# Patient Record
Sex: Female | Born: 1985 | Race: Black or African American | Hispanic: No | Marital: Married | Smoking: Never smoker
Health system: Southern US, Community
[De-identification: ages and names within clinical notes are randomized; demographics above are authoritative.]

## PROBLEM LIST (undated history)

## (undated) DIAGNOSIS — A749 Chlamydial infection, unspecified: Secondary | ICD-10-CM

## (undated) DIAGNOSIS — B009 Herpesviral infection, unspecified: Secondary | ICD-10-CM

## (undated) DIAGNOSIS — L659 Nonscarring hair loss, unspecified: Secondary | ICD-10-CM

## (undated) DIAGNOSIS — F419 Anxiety disorder, unspecified: Secondary | ICD-10-CM

## (undated) DIAGNOSIS — D241 Benign neoplasm of right breast: Secondary | ICD-10-CM

## (undated) DIAGNOSIS — R519 Headache, unspecified: Secondary | ICD-10-CM

## (undated) HISTORY — DX: Headache, unspecified: R51.9

## (undated) HISTORY — DX: Herpesviral infection, unspecified: B00.9

## (undated) HISTORY — DX: Nonscarring hair loss, unspecified: L65.9

## (undated) HISTORY — DX: Anxiety disorder, unspecified: F41.9

## (undated) HISTORY — DX: Chlamydial infection, unspecified: A74.9

## (undated) HISTORY — DX: Benign neoplasm of right breast: D24.1

---

## 2007-07-04 ENCOUNTER — Emergency Department (HOSPITAL_COMMUNITY): Admission: EM | Admit: 2007-07-04 | Discharge: 2007-07-04 | Payer: Self-pay | Admitting: Emergency Medicine

## 2007-10-14 ENCOUNTER — Other Ambulatory Visit: Admission: RE | Admit: 2007-10-14 | Discharge: 2007-10-14 | Payer: Self-pay | Admitting: Gynecology

## 2017-11-13 LAB — HM PAP SMEAR

## 2018-08-19 ENCOUNTER — Ambulatory Visit: Payer: Managed Care, Other (non HMO) | Admitting: Nurse Practitioner

## 2018-08-19 ENCOUNTER — Encounter: Payer: Self-pay | Admitting: Nurse Practitioner

## 2018-08-19 ENCOUNTER — Other Ambulatory Visit: Payer: Self-pay

## 2018-08-19 VITALS — BP 110/60 | HR 94 | Temp 98.3°F | Ht 67.4 in | Wt 127.6 lb

## 2018-08-19 DIAGNOSIS — N6321 Unspecified lump in the left breast, upper outer quadrant: Secondary | ICD-10-CM

## 2018-08-19 DIAGNOSIS — Z0000 Encounter for general adult medical examination without abnormal findings: Secondary | ICD-10-CM

## 2018-08-19 DIAGNOSIS — Z23 Encounter for immunization: Secondary | ICD-10-CM | POA: Diagnosis not present

## 2018-08-19 DIAGNOSIS — N63 Unspecified lump in unspecified breast: Secondary | ICD-10-CM

## 2018-08-19 LAB — POCT URINALYSIS DIPSTICK
Bilirubin, UA: NEGATIVE
Glucose, UA: NEGATIVE
Ketones, UA: NEGATIVE
Leukocytes, UA: NEGATIVE
Nitrite, UA: NEGATIVE
Protein, UA: NEGATIVE
Spec Grav, UA: 1.02 (ref 1.010–1.025)
Urobilinogen, UA: 0.2 E.U./dL
pH, UA: 6 (ref 5.0–8.0)

## 2018-08-19 MED ORDER — TETANUS-DIPHTH-ACELL PERTUSSIS 5-2.5-18.5 LF-MCG/0.5 IM SUSP
0.5000 mL | Freq: Once | INTRAMUSCULAR | Status: AC
  Administered 2018-08-19: 0.5 mL via INTRAMUSCULAR

## 2018-08-19 NOTE — Progress Notes (Addendum)
Subjective:     Patient ID: Jasmin Jackson , female    DOB: [birth year] , 33 y.o.   MRN: [id number]   Chief Complaint  Patient presents with  . Annual Exam  . Establish Care    HPI  Here to establish care - was in [place].  Has been here for a year and half.  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 [place]. No family history of breast cancer.   Mother passed with liver disease.  Father - healthy.  Brother - healthy.   She has seen a GYN here - Dr. Garwin Brothers      History reviewed. No pertinent past medical history.   Family History  Problem Relation Age of Onset  . Liver disease Mother     No current outpatient medications on file.   No Known Allergies   Review of Systems  Constitutional: Negative.   HENT: Negative.   Eyes: Negative.   Respiratory: Negative.   Cardiovascular: Negative.   Gastrointestinal: Negative.   Endocrine: Negative for polydipsia, polyphagia and polyuria.  Genitourinary: Negative.        Left breast lump   Musculoskeletal: Negative.   Skin: Negative.   Neurological: Negative.   Hematological: Negative.   Psychiatric/Behavioral: Negative.      Today's Vitals   08/19/18 1047  BP: 110/60  Pulse: 94  Temp: 98.3 F (36.8 C)  TempSrc: Oral  Weight: 127 lb 9.6 oz (57.9 kg)  Height: 5' 7.4" (1.712 m)  PainSc: 0-No pain   Body mass index is 19.75 kg/m.   Objective:  Physical Exam Vitals signs reviewed.  Constitutional:      Appearance: Normal appearance. She is well-developed.  HENT:     Head: Normocephalic and atraumatic.     Right Ear: Hearing, tympanic membrane, ear canal and external ear normal.     Left Ear: Hearing, tympanic membrane, ear canal and external ear normal.     Nose: Nose normal.  Eyes:     General: Lids are normal.     Conjunctiva/sclera:  Conjunctivae normal.     Pupils: Pupils are equal, round, and reactive to light.     Funduscopic exam:    Right eye: No papilledema.        Left eye: No papilledema.  Neck:     Musculoskeletal: Full passive range of motion without pain, normal range of motion and neck supple. No muscular tenderness.     Thyroid: No thyroid mass.     Vascular: No carotid bruit.  Cardiovascular:     Rate and Rhythm: Normal rate and regular rhythm.     Pulses: Normal pulses.     Heart sounds: Normal heart sounds. No murmur.  Pulmonary:     Effort: Pulmonary effort is normal.     Breath sounds: Normal breath sounds.  Chest:     Breasts:        Right: Normal. No swelling, mass, nipple discharge or tenderness.        Left: Mass (firm at 2 o'clock) present. No swelling, nipple discharge or tenderness.  Abdominal:     General: Abdomen is flat. Bowel sounds are normal.     Palpations: Abdomen is soft.  Musculoskeletal: Normal range of motion.        General: No swelling or tenderness.  Right lower leg: No edema.     Left lower leg: No edema.  Skin:    General: Skin is warm and dry.     Capillary Refill: Capillary refill takes less than 2 seconds.  Neurological:     General: No focal deficit present.     Mental Status: She is alert and oriented to person, place, and time.     Cranial Nerves: No cranial nerve deficit.     Sensory: No sensory deficit.  Psychiatric:        Mood and Affect: Mood normal.        Behavior: Behavior normal.        Thought Content: Thought content normal.        Judgment: Judgment normal.         Assessment And Plan:     1. Encounter for general adult medical examination w/o abnormal findings . Behavior modifications discussed and diet history reviewed.   . Pt will continue to exercise regularly and modify diet with low GI, plant based foods and decrease intake of processed foods.  . Recommend intake of daily multivitamin, Vitamin D, and calcium.  . Recommend  mammogram and colonoscopy for preventive screenings, as well as recommend immunizations that include influenza, TDAP - POCT Urinalysis Dipstick (81002)    1. Encounter for general adult medical examination w/o abnormal findings  Will give tetanus vaccine today while in office. Refer to order management. TDAP will be administered to adults 37-1 years old every 10 years. - POCT Urinalysis Dipstick (81002) - Tdap (BOOSTRIX) injection 0.5 mL - CBC no Diff - CMP14 + Anion Gap - Lipid Profile  3. Breast lump in lower outer quadrant  Reports has been present for several years  Will try to obtain her records from previous provider for review  Present on left breast at 2 o'clock  Minette Brine, FNP    THE PATIENT IS ENCOURAGED TO PRACTICE SOCIAL DISTANCING DUE TO THE COVID-19 PANDEMIC.

## 2019-03-11 ENCOUNTER — Ambulatory Visit: Payer: Managed Care, Other (non HMO) | Admitting: Nurse Practitioner

## 2019-03-19 ENCOUNTER — Other Ambulatory Visit: Payer: Self-pay

## 2019-03-19 ENCOUNTER — Ambulatory Visit: Payer: 59 | Admitting: Nurse Practitioner

## 2019-03-19 VITALS — BP 116/68 | HR 102 | Temp 98.2°F | Ht 67.4 in | Wt 126.2 lb

## 2019-03-19 DIAGNOSIS — F419 Anxiety disorder, unspecified: Secondary | ICD-10-CM

## 2019-03-19 DIAGNOSIS — G4700 Insomnia, unspecified: Secondary | ICD-10-CM

## 2019-03-19 DIAGNOSIS — R519 Headache, unspecified: Secondary | ICD-10-CM | POA: Diagnosis not present

## 2019-03-19 MED ORDER — TRAZODONE HCL 50 MG PO TABS: 50.0000 mg | ORAL_TABLET | Freq: Every day | ORAL | 0 refills | Status: DC

## 2019-03-19 MED ORDER — MAGNESIUM 200 MG PO TABS: ORAL_TABLET | ORAL | 2 refills | Status: DC

## 2019-03-19 NOTE — Progress Notes (Signed)
This visit occurred during the SARS-CoV-2 public health emergency.  Safety protocols were in place, including screening questions prior to the visit, additional usage of staff PPE, and extensive cleaning of exam room while observing appropriate contact time as indicated for disinfecting solutions.  Subjective:     Patient ID: Jasmin Jackson , female    DOB: December 24, 1985 , 34 y.o.   MRN: [id number]   Chief Complaint  Patient presents with  . Anxiety    HPI  She has never taken any medications in the past. She has tried melatonin without relief. She sees Dr. Garwin Brothers for her GYN  Anxiety Presents for initial visit. Onset was 1 to 6 months ago. The problem has been unchanged. Symptoms include insomnia and nervous/anxious behavior. Patient reports no chest pain or palpitations. Symptoms occur constantly. Nothing aggravates the symptoms.   Her past medical history is significant for anxiety/panic attacks. There is no history of anemia, arrhythmia, asthma or depression.  Insomnia Primary symptoms: no fragmented sleep, difficulty falling asleep.  The current episode started more than one month. The onset quality is sudden (noticed after working with [employer]). Typical bedtime:  10-11 P.M..  PMH includes: associated symptoms present, no hypertension.     No past medical history on file.   Family History  Problem Relation Age of Onset  . Liver disease Mother     No current outpatient medications on file.   No Known Allergies   Review of Systems  Constitutional: Negative.   Respiratory: Negative.   Cardiovascular: Negative for chest pain, palpitations and leg swelling.  Neurological: Negative.   Psychiatric/Behavioral: Negative for agitation. The patient is nervous/anxious and has insomnia.      Today's Vitals   03/19/19 1023  BP: 116/68  Pulse: (!) 102  Temp: 98.2 F (36.8 C)  TempSrc: Oral  Weight: 126 lb 3.2 oz (57.2 kg)  Height: 5' 7.4" (1.712 m)   Body mass index is 19.53  kg/m.   Objective:  Physical Exam Constitutional:      Appearance: Normal appearance.  Cardiovascular:     Rate and Rhythm: Normal rate and regular rhythm.     Pulses: Normal pulses.     Heart sounds: Normal heart sounds. No murmur.  Pulmonary:     Effort: Pulmonary effort is normal. No respiratory distress.     Breath sounds: Normal breath sounds.  Skin:    Capillary Refill: Capillary refill takes less than 2 seconds.  Neurological:     General: No focal deficit present.     Mental Status: She is alert and oriented to person, place, and time.  Psychiatric:        Mood and Affect: Mood normal.        Behavior: Behavior normal.        Thought Content: Thought content normal.        Judgment: Judgment normal.         Assessment And Plan:     1. Anxiety  Will try her on trazodone this will also help with insomnia.  - traZODone (DESYREL) 50 MG tablet; Take 1 tablet (50 mg total) by mouth at bedtime.  Dispense: 30 tablet; Refill: 0  2. Insomnia, unspecified type  Melatonin was ineffective  Will try trazodone as needed - traZODone (DESYREL) 50 MG tablet; Take 1 tablet (50 mg total) by mouth at bedtime.  Dispense: 30 tablet; Refill: 0  3. Nonintractable episodic headache, unspecified headache type  May be occurring due to lack of sleep and anxiety  Advised to take tylenol as needed for headache and to make sure staying well hydrated with water and avoiding food triggers.   Minette Brine, FNP    THE PATIENT IS ENCOURAGED TO PRACTICE SOCIAL DISTANCING DUE TO THE COVID-19 PANDEMIC.

## 2019-03-23 ENCOUNTER — Encounter: Payer: Self-pay | Admitting: Nurse Practitioner

## 2019-04-10 ENCOUNTER — Other Ambulatory Visit: Payer: Self-pay | Admitting: Nurse Practitioner

## 2019-04-10 DIAGNOSIS — F419 Anxiety disorder, unspecified: Secondary | ICD-10-CM

## 2019-04-10 DIAGNOSIS — G4700 Insomnia, unspecified: Secondary | ICD-10-CM

## 2019-04-15 ENCOUNTER — Ambulatory Visit: Payer: 59 | Admitting: Nurse Practitioner

## 2019-04-15 ENCOUNTER — Other Ambulatory Visit: Payer: Self-pay

## 2019-04-15 ENCOUNTER — Encounter: Payer: Self-pay | Admitting: Nurse Practitioner

## 2019-04-15 VITALS — BP 122/86 | HR 81 | Temp 97.7°F | Ht 68.0 in | Wt 125.2 lb

## 2019-04-15 DIAGNOSIS — G8929 Other chronic pain: Secondary | ICD-10-CM

## 2019-04-15 DIAGNOSIS — G4700 Insomnia, unspecified: Secondary | ICD-10-CM | POA: Diagnosis not present

## 2019-04-15 DIAGNOSIS — R519 Headache, unspecified: Secondary | ICD-10-CM | POA: Diagnosis not present

## 2019-04-15 DIAGNOSIS — F419 Anxiety disorder, unspecified: Secondary | ICD-10-CM

## 2019-04-15 MED ORDER — UBRELVY 50 MG PO TABS: 1.0000 | ORAL_TABLET | Freq: Every day | ORAL | 2 refills | Status: DC | PRN

## 2019-04-15 NOTE — Progress Notes (Signed)
  This visit occurred during the SARS-CoV-2 public health emergency.  Safety protocols were in place, including screening questions prior to the visit, additional usage of staff PPE, and extensive cleaning of exam room while observing appropriate contact time as indicated for disinfecting solutions.  Subjective:     Patient ID: Jasmin Jackson , female    DOB: [birth year] , 34 y.o.   MRN: [id number]   Chief Complaint  Patient presents with  . Anxiety    HPI  She takes the sleep aid about 9pm which has helped her to sleep better but she is groggy.    Anxiety Presents for follow-up visit. Patient reports no chest pain, dizziness or palpitations. Symptoms occur most days. The severity of symptoms is moderate.       Past Medical History:  Diagnosis Date  . Anxiety      Family History  Problem Relation Age of Onset  . Liver disease Mother      Current Outpatient Medications:  Marland Kitchen  Magnesium 200 MG TABS, Take one tablet by mouth with evening meals, Disp: 30 tablet, Rfl: 2 .  traZODone (DESYREL) 50 MG tablet, Take 1 tablet (50 mg total) by mouth at bedtime., Disp: 30 tablet, Rfl: 0   No Known Allergies   Review of Systems  Constitutional: Negative.   Respiratory: Negative.  Negative for cough.   Cardiovascular: Negative.  Negative for chest pain, palpitations and leg swelling.  Neurological: Negative for dizziness and headaches.  Psychiatric/Behavioral: Negative.      Today's Vitals   04/15/19 1448  BP: 122/86  Pulse: 81  Temp: 97.7 F (36.5 C)  Weight: 125 lb 3.2 oz (56.8 kg)  Height: 5\' 8"  (1.727 m)   Body mass index is 19.04 kg/m.   Objective:  Physical Exam Constitutional:      Appearance: Normal appearance.  Cardiovascular:     Rate and Rhythm: Normal rate and regular rhythm.     Pulses: Normal pulses.     Heart sounds: Normal heart sounds. No murmur.  Neurological:     General: No focal deficit present.     Mental Status: She is alert and oriented to  person, place, and time.     Cranial Nerves: No cranial nerve deficit.  Psychiatric:        Mood and Affect: Mood normal.        Behavior: Behavior normal.        Thought Content: Thought content normal.        Judgment: Judgment normal.         Assessment And Plan:     1. Anxiety  Chronic, she has this when she is unable to sleep  2. Insomnia, unspecified type  Take 1/2 tab of trazadone to see if this helps with your grogginess in am.   3. Chronic nonintractable headache, unspecified headache type  She has tried imitrex and maxalt without benefit and caused her sleepiness.  - Ubrogepant (UBRELVY) 50 MG TABS; Take 1 tablet by mouth daily as needed. At onset of headache  Dispense: 10 tablet; Refill: 2   Minette Brine, FNP    THE PATIENT IS ENCOURAGED TO PRACTICE SOCIAL DISTANCING DUE TO THE COVID-19 PANDEMIC.

## 2019-04-16 ENCOUNTER — Ambulatory Visit: Payer: 59 | Admitting: Nurse Practitioner

## 2019-04-17 ENCOUNTER — Telehealth: Payer: Self-pay

## 2019-04-17 NOTE — Telephone Encounter (Signed)
PA for Roselyn Meier has been submitted through covermymeds and we are just waiting for the determination. YL,RMA

## 2019-04-20 ENCOUNTER — Encounter: Payer: Self-pay | Admitting: Nurse Practitioner

## 2019-04-22 ENCOUNTER — Telehealth: Payer: Self-pay

## 2019-04-22 DIAGNOSIS — F320 Major depressive disorder, single episode, mild: Secondary | ICD-10-CM | POA: Diagnosis not present

## 2019-04-22 NOTE — Telephone Encounter (Signed)
I called patient and left her a v/m notifying her that her PA for Roselyn Meier has been approved from 04/21/19 to 10/21/19. YL,RMA

## 2019-05-08 DIAGNOSIS — F320 Major depressive disorder, single episode, mild: Secondary | ICD-10-CM | POA: Diagnosis not present

## 2019-05-22 DIAGNOSIS — F320 Major depressive disorder, single episode, mild: Secondary | ICD-10-CM | POA: Diagnosis not present

## 2019-06-05 DIAGNOSIS — F320 Major depressive disorder, single episode, mild: Secondary | ICD-10-CM | POA: Diagnosis not present

## 2019-06-19 DIAGNOSIS — N909 Noninflammatory disorder of vulva and perineum, unspecified: Secondary | ICD-10-CM | POA: Diagnosis not present

## 2019-07-15 DIAGNOSIS — F320 Major depressive disorder, single episode, mild: Secondary | ICD-10-CM | POA: Diagnosis not present

## 2019-07-29 DIAGNOSIS — F320 Major depressive disorder, single episode, mild: Secondary | ICD-10-CM | POA: Diagnosis not present

## 2019-08-14 DIAGNOSIS — F320 Major depressive disorder, single episode, mild: Secondary | ICD-10-CM | POA: Diagnosis not present

## 2019-08-21 ENCOUNTER — Other Ambulatory Visit: Payer: Self-pay

## 2019-08-21 ENCOUNTER — Encounter: Payer: Self-pay | Admitting: Nurse Practitioner

## 2019-08-21 ENCOUNTER — Ambulatory Visit (INDEPENDENT_AMBULATORY_CARE_PROVIDER_SITE_OTHER): Payer: 59 | Admitting: Nurse Practitioner

## 2019-08-21 VITALS — BP 112/78 | HR 85 | Temp 97.9°F | Ht 67.2 in | Wt 128.8 lb

## 2019-08-21 DIAGNOSIS — M25512 Pain in left shoulder: Secondary | ICD-10-CM

## 2019-08-21 DIAGNOSIS — Z1159 Encounter for screening for other viral diseases: Secondary | ICD-10-CM | POA: Diagnosis not present

## 2019-08-21 DIAGNOSIS — N63 Unspecified lump in unspecified breast: Secondary | ICD-10-CM

## 2019-08-21 DIAGNOSIS — Z0001 Encounter for general adult medical examination with abnormal findings: Secondary | ICD-10-CM

## 2019-08-21 DIAGNOSIS — Z0000 Encounter for general adult medical examination without abnormal findings: Secondary | ICD-10-CM | POA: Diagnosis not present

## 2019-08-21 NOTE — Patient Instructions (Addendum)
Health Maintenance, Female Adopting a healthy lifestyle and getting preventive care are important in promoting health and wellness. Ask your health care provider about:  The right schedule for you to have regular tests and exams.  Things you can do on your own to prevent diseases and keep yourself healthy. What should I know about diet, weight, and exercise? Eat a healthy diet   Eat a diet that includes plenty of vegetables, fruits, low-fat dairy products, and lean protein.  Do not eat a lot of foods that are high in solid fats, added sugars, or sodium. Maintain a healthy weight Body mass index (BMI) is used to identify weight problems. It estimates body fat based on height and weight. Your health care provider can help determine your BMI and help you achieve or maintain a healthy weight. Get regular exercise Get regular exercise. This is one of the most important things you can do for your health. Most adults should:  Exercise for at least 150 minutes each week. The exercise should increase your heart rate and make you sweat (moderate-intensity exercise).  Do strengthening exercises at least twice a week. This is in addition to the moderate-intensity exercise.  Spend less time sitting. Even light physical activity can be beneficial. Watch cholesterol and blood lipids Have your blood tested for lipids and cholesterol at 32 years of age, then have this test every 5 years. Have your cholesterol levels checked more often if:  Your lipid or cholesterol levels are high.  You are older than 32 years of age.  You are at high risk for heart disease. What should I know about cancer screening? Depending on your health history and family history, you may need to have cancer screening at various ages. This may include screening for:  Breast cancer.  Cervical cancer.  Colorectal cancer.  Skin cancer.  Lung cancer. What should I know about heart disease, diabetes, and high blood  pressure? Blood pressure and heart disease  High blood pressure causes heart disease and increases the risk of stroke. This is more likely to develop in people who have high blood pressure readings, are of African descent, or are overweight.  Have your blood pressure checked: ? Every 3-5 years if you are 18-39 years of age. ? Every year if you are 40 years old or older. Diabetes Have regular diabetes screenings. This checks your fasting blood sugar level. Have the screening done:  Once every three years after age 40 if you are at a normal weight and have a low risk for diabetes.  More often and at a younger age if you are overweight or have a high risk for diabetes. What should I know about preventing infection? Hepatitis B If you have a higher risk for hepatitis B, you should be screened for this virus. Talk with your health care provider to find out if you are at risk for hepatitis B infection. Hepatitis C Testing is recommended for:  Everyone born from 1945 through 1965.  Anyone with known risk factors for hepatitis C. Sexually transmitted infections (STIs)  Get screened for STIs, including gonorrhea and chlamydia, if: ? You are sexually active and are younger than 32 years of age. ? You are older than 32 years of age and your health care provider tells you that you are at risk for this type of infection. ? Your sexual activity has changed since you were last screened, and you are at increased risk for chlamydia or gonorrhea. Ask your health care provider if   you are at risk.  Ask your health care provider about whether you are at high risk for HIV. Your health care provider may recommend a prescription medicine to help prevent HIV infection. If you choose to take medicine to prevent HIV, you should first get tested for HIV. You should then be tested every 3 months for as long as you are taking the medicine. Pregnancy  If you are about to stop having your period (premenopausal) and  you may become pregnant, seek counseling before you get pregnant.  Take 400 to 800 micrograms (mcg) of folic acid every day if you become pregnant.  Ask for birth control (contraception) if you want to prevent pregnancy. Osteoporosis and menopause Osteoporosis is a disease in which the bones lose minerals and strength with aging. This can result in bone fractures. If you are 99 years old or older, or if you are at risk for osteoporosis and fractures, ask your health care provider if you should:  Be screened for bone loss.  Take a calcium or vitamin D supplement to lower your risk of fractures.  Be given hormone replacement therapy (HRT) to treat symptoms of menopause. Follow these instructions at home: Lifestyle  Do not use any products that contain nicotine or tobacco, such as cigarettes, e-cigarettes, and chewing tobacco. If you need help quitting, ask your health care provider.  Do not use street drugs.  Do not share needles.  Ask your health care provider for help if you need support or information about quitting drugs. Alcohol use  Do not drink alcohol if: ? Your health care provider tells you not to drink. ? You are pregnant, may be pregnant, or are planning to become pregnant.  If you drink alcohol: ? Limit how much you use to 0-1 drink a day. ? Limit intake if you are breastfeeding.  Be aware of how much alcohol is in your drink. In the [place] one drink equals one 12 oz bottle of beer (355 mL), one 5 oz glass of wine (148 mL), or one 1 oz glass of hard liquor (44 mL). General instructions  Schedule regular health, dental, and eye exams.  Stay current with your vaccines.  Tell your health care provider if: ? You often feel depressed. ? You have ever been abused or do not feel safe at home. Summary  Adopting a healthy lifestyle and getting preventive care are important in promoting health and wellness.  Follow your health care provider's instructions about healthy  diet, exercising, and getting tested or screened for diseases.  Follow your health care provider's instructions on monitoring your cholesterol and blood pressure. This information is not intended to replace advice given to you by your health care provider. Make sure you discuss any questions you have with your health care provider. Document Revised: 01/09/2018 Document Reviewed: 01/09/2018 Elsevier Patient Education  [facility] MASSAGES AND VOLTAREN GEL OVER THE COUNTER TO HELP WITH SHOULDER PAIN.

## 2019-08-21 NOTE — Progress Notes (Signed)
I, [employer] as a Education administrator for [employer], FNP.,have documented all relevant documentation on the behalf of Minette Brine, FNP,as directed by  Minette Brine, FNP while in the presence of Minette Brine, [place]. This visit occurred during the SARS-CoV-2 public health emergency.  Safety protocols were in place, including screening questions prior to the visit, additional usage of staff PPE, and extensive cleaning of exam room while observing appropriate contact time as indicated for disinfecting solutions.  Subjective:     Patient ID: Jasmin Jackson , female    DOB: [birth year] , 34 y.o.   MRN: [id number]   Chief Complaint  Patient presents with  . Annual Exam    HPI  Patient here for health maintenance. Patient complains of left should pain that started 3 weeks ago. Pain is sharp and is aggravated by using the phone at work and head movements. The pain last for 5 minutes for less.  She goes to the opthalmologist yearly.    Past Medical History:  Diagnosis Date  . Anxiety      Family History  Problem Relation Age of Onset  . Liver disease Mother      Current Outpatient Medications:  Marland Kitchen  Magnesium 200 MG TABS, Take one tablet by mouth with evening meals, Disp: 30 tablet, Rfl: 2 .  traZODone (DESYREL) 50 MG tablet, Take 1 tablet (50 mg total) by mouth at bedtime., Disp: 30 tablet, Rfl: 0 .  Ubrogepant (UBRELVY) 50 MG TABS, Take 1 tablet by mouth daily as needed. At onset of headache, Disp: 10 tablet, Rfl: 2   No Known Allergies    The patient states she uses none for birth control. Patient's last menstrual period was 08/21/2019.. Negative for Dysmenorrhea and Negative for Menorrhagia. Negative for: breast discharge, breast lump(s), breast pain and breast self exam. Associated symptoms include abnormal vaginal bleeding. Pertinent negatives include abnormal bleeding (hematology), anxiety, decreased libido, depression, difficulty falling sleep, dyspareunia, history of  infertility, nocturia, sexual dysfunction, sleep disturbances, urinary incontinence, urinary urgency, vaginal discharge and vaginal itching. Diet regular.The patient states her exercise level is minimal walks at work.  The patient's tobacco use is:   Social History   Tobacco Use  Smoking Status Never Smoker  Smokeless Tobacco Never Used   She has been exposed to passive smoke. The patient's alcohol use is:  Social History   Substance and Sexual Activity  Alcohol Use Yes   Comment: occasionally    Additional information: Last pap 2019 will get records from Dr.Cousins, next one scheduled for .    Review of Systems  Constitutional: Negative.   HENT: Negative.   Eyes: Negative.        Does wear glasses has eyes checked every 2 years, does not have on at this time.   Respiratory: Negative.   Cardiovascular: Negative.   Gastrointestinal: Negative.   Endocrine: Negative.   Genitourinary: Negative.   Musculoskeletal: Positive for arthralgias (left shoulder pain).  Skin: Negative.   Neurological: Negative.   Psychiatric/Behavioral: Negative.      Today's Vitals   08/21/19 1459  BP: 112/78  Pulse: 85  Temp: 97.9 F (36.6 C)  TempSrc: Oral  Weight: 128 lb 12.8 oz (58.4 kg)  Height: 5' 7.2" (1.707 m)  PainSc: 6   PainLoc: Arm   Body mass index is 20.05 kg/m.   Objective:  Physical Exam Constitutional:      General: She is not in acute distress.    Appearance: Normal appearance. She is normal weight.  HENT:  Right Ear: Tympanic membrane, ear canal and external ear normal. There is no impacted cerumen (small amount to ear canal).     Left Ear: Tympanic membrane, ear canal and external ear normal. There is no impacted cerumen (small amount to ear canal).  Eyes:     Extraocular Movements: Extraocular movements intact.     Conjunctiva/sclera: Conjunctivae normal.     Pupils: Pupils are equal, round, and reactive to light.  Cardiovascular:     Rate and Rhythm: Normal  rate and regular rhythm.     Pulses: Normal pulses.     Heart sounds: Normal heart sounds. No murmur heard.   Pulmonary:     Effort: Pulmonary effort is normal. No respiratory distress.     Breath sounds: Normal breath sounds.  Chest:     Chest wall: No mass.     Breasts: Tanner Score is 5.        Right: Normal.        Left: Mass (at 11 o'clock inner breast area) present.  Abdominal:     General: Abdomen is flat. Bowel sounds are normal.     Palpations: Abdomen is soft.  Musculoskeletal:        General: Normal range of motion.     Right shoulder: Normal.     Left shoulder: Normal. No tenderness or crepitus. Normal range of motion.     Cervical back: Normal range of motion and neck supple.     Right lower leg: No edema.     Left lower leg: No edema.  Lymphadenopathy:     Upper Body:     Right upper body: No supraclavicular or axillary adenopathy.     Left upper body: No supraclavicular or axillary adenopathy.  Skin:    General: Skin is warm and dry.     Capillary Refill: Capillary refill takes less than 2 seconds.  Neurological:     General: No focal deficit present.     Mental Status: She is alert and oriented to person, place, and time.     Cranial Nerves: No cranial nerve deficit.  Psychiatric:        Mood and Affect: Mood normal.        Behavior: Behavior normal.        Thought Content: Thought content normal.        Judgment: Judgment normal.         Assessment And Plan:     1. Annual visit for general adult medical examination with abnormal findings . Behavior modifications discussed and diet history reviewed.   . Pt will continue to exercise regularly and modify diet with low GI, plant based foods and decrease intake of processed foods.  . Recommend intake of daily multivitamin, Vitamin D, and calcium.  . Recommend diagnostic mammogram to check nodule that has been present for more than 5 years for preventive screenings, as well as recommend immunizations that  include influenza, TDAP (up to date) . Will get records from Dr. Garwin Brothers for last PAP and HIV screening - CMP14+EGFR - CBC - Lipid panel  2. Encounter for hepatitis C screening test for low risk patient  Will check Hepatitis C screening due to recent recommendations to screen all adults 18 years and older - Hepatitis C antibody  3. Breast nodule  Firm, mobile nodule palpated at 11 o'clock inner area  Will send for diagnostic mammogram, last done in 2016 in [place]; Future  4. Left shoulder pain, unspecified  chronicity  No abnormal findings with shoulder, this is likely related to overuse with phone and resting on shoulder   Encouraged to get headset while on phone and to consider massage therapy - VITAMIN D 25 Hydroxy (Vit-D Deficiency, Fractures)    Patient was given opportunity to ask questions. Patient verbalized understanding of the plan and was able to repeat key elements of the plan. All questions were answered to their satisfaction.   Minette Brine, FNP   I, Minette Brine, FNP, have reviewed all documentation for this visit. The documentation on 08/21/19 for the exam, diagnosis, procedures, and orders are all accurate and complete.  THE PATIENT IS ENCOURAGED TO PRACTICE SOCIAL DISTANCING DUE TO THE COVID-19 PANDEMIC.

## 2019-08-22 LAB — LIPID PANEL
Chol/HDL Ratio: 2.9 ratio (ref 0.0–4.4)
Cholesterol, Total: 173 mg/dL (ref 100–199)
HDL: 60 mg/dL (ref >39)
LDL Chol Calc (NIH): 102 mg/dL — ABNORMAL HIGH (ref 0–99)
Triglycerides: 53 mg/dL (ref 0–149)
VLDL Cholesterol Cal: 11 mg/dL (ref 5–40)

## 2019-08-22 LAB — CMP14+EGFR
ALT: 6 IU/L (ref 0–32)
AST: 14 IU/L (ref 0–40)
Albumin/Globulin Ratio: 1.7 (ref 1.2–2.2)
Albumin: 4.6 g/dL (ref 3.8–4.8)
Alkaline Phosphatase: 70 IU/L (ref 48–121)
BUN/Creatinine Ratio: 9 (ref 9–23)
BUN: 7 mg/dL (ref 6–20)
Bilirubin Total: 0.5 mg/dL (ref 0.0–1.2)
CO2: 23 mmol/L (ref 20–29)
Calcium: 9.2 mg/dL (ref 8.7–10.2)
Chloride: 104 mmol/L (ref 96–106)
Creatinine, Ser: 0.76 mg/dL (ref 0.57–1.00)
GFR calc Af Amer: 120 mL/min/{1.73_m2} (ref >59)
GFR calc non Af Amer: 104 mL/min/{1.73_m2} (ref >59)
Globulin, Total: 2.7 g/dL (ref 1.5–4.5)
Glucose: 61 mg/dL — ABNORMAL LOW (ref 65–99)
Potassium: 3.8 mmol/L (ref 3.5–5.2)
Sodium: 141 mmol/L (ref 134–144)
Total Protein: 7.3 g/dL (ref 6.0–8.5)

## 2019-08-22 LAB — CBC
Hematocrit: 36.9 % (ref 34.0–46.6)
Hemoglobin: 12.7 g/dL (ref 11.1–15.9)
MCH: 28.5 pg (ref 26.6–33.0)
MCHC: 34.4 g/dL (ref 31.5–35.7)
MCV: 83 fL (ref 79–97)
Platelets: 306 10*3/uL (ref 150–450)
RBC: 4.45 x10E6/uL (ref 3.77–5.28)
RDW: 13.3 % (ref 11.7–15.4)
WBC: 4.2 10*3/uL (ref 3.4–10.8)

## 2019-08-22 LAB — VITAMIN D 25 HYDROXY (VIT D DEFICIENCY, FRACTURES): Vit D, 25-Hydroxy: 13.9 ng/mL — ABNORMAL LOW (ref 30.0–100.0)

## 2019-08-22 LAB — HEPATITIS C ANTIBODY: Hep C Virus Ab: <0.1 s/co ratio (ref 0.0–0.9)

## 2019-08-26 ENCOUNTER — Other Ambulatory Visit: Payer: Self-pay | Admitting: Nurse Practitioner

## 2019-08-26 DIAGNOSIS — N63 Unspecified lump in unspecified breast: Secondary | ICD-10-CM

## 2019-08-27 ENCOUNTER — Encounter: Payer: Self-pay | Admitting: Nurse Practitioner

## 2019-08-28 DIAGNOSIS — F320 Major depressive disorder, single episode, mild: Secondary | ICD-10-CM | POA: Diagnosis not present

## 2019-09-23 DIAGNOSIS — Z20828 Contact with and (suspected) exposure to other viral communicable diseases: Secondary | ICD-10-CM | POA: Diagnosis not present

## 2019-10-02 DIAGNOSIS — F320 Major depressive disorder, single episode, mild: Secondary | ICD-10-CM | POA: Diagnosis not present

## 2019-10-15 DIAGNOSIS — F320 Major depressive disorder, single episode, mild: Secondary | ICD-10-CM | POA: Diagnosis not present

## 2019-11-11 DIAGNOSIS — H52223 Regular astigmatism, bilateral: Secondary | ICD-10-CM | POA: Diagnosis not present

## 2019-12-02 DIAGNOSIS — L659 Nonscarring hair loss, unspecified: Secondary | ICD-10-CM | POA: Diagnosis not present

## 2019-12-02 DIAGNOSIS — Z01419 Encounter for gynecological examination (general) (routine) without abnormal findings: Secondary | ICD-10-CM | POA: Diagnosis not present

## 2019-12-02 DIAGNOSIS — Z124 Encounter for screening for malignant neoplasm of cervix: Secondary | ICD-10-CM | POA: Diagnosis not present

## 2019-12-08 ENCOUNTER — Ambulatory Visit
Admission: RE | Admit: 2019-12-08 | Discharge: 2019-12-08 | Disposition: A | Discharge status: Home / Self Care | Payer: 59 | Source: Ambulatory Visit | Attending: Nurse Practitioner | Admitting: Nurse Practitioner

## 2019-12-08 ENCOUNTER — Other Ambulatory Visit: Payer: Self-pay

## 2019-12-08 DIAGNOSIS — N63 Unspecified lump in unspecified breast: Secondary | ICD-10-CM

## 2019-12-08 DIAGNOSIS — N6021 Fibroadenosis of right breast: Secondary | ICD-10-CM | POA: Diagnosis not present

## 2019-12-29 ENCOUNTER — Other Ambulatory Visit: Payer: Self-pay | Admitting: General Surgery

## 2019-12-29 DIAGNOSIS — N6320 Unspecified lump in the left breast, unspecified quadrant: Secondary | ICD-10-CM | POA: Diagnosis not present

## 2020-02-06 DIAGNOSIS — N912 Amenorrhea, unspecified: Secondary | ICD-10-CM | POA: Diagnosis not present

## 2020-02-10 DIAGNOSIS — N912 Amenorrhea, unspecified: Secondary | ICD-10-CM | POA: Diagnosis not present

## 2020-02-25 DIAGNOSIS — Z3200 Encounter for pregnancy test, result unknown: Secondary | ICD-10-CM | POA: Diagnosis not present

## 2020-03-25 DIAGNOSIS — O3413 Maternal care for benign tumor of corpus uteri, third trimester: Secondary | ICD-10-CM | POA: Diagnosis not present

## 2020-03-25 DIAGNOSIS — O98813 Other maternal infectious and parasitic diseases complicating pregnancy, third trimester: Secondary | ICD-10-CM | POA: Diagnosis not present

## 2020-03-25 DIAGNOSIS — Z3403 Encounter for supervision of normal first pregnancy, third trimester: Secondary | ICD-10-CM | POA: Diagnosis not present

## 2020-03-25 DIAGNOSIS — N898 Other specified noninflammatory disorders of vagina: Secondary | ICD-10-CM | POA: Diagnosis not present

## 2020-03-25 DIAGNOSIS — Z3401 Encounter for supervision of normal first pregnancy, first trimester: Secondary | ICD-10-CM | POA: Diagnosis not present

## 2020-03-25 DIAGNOSIS — Z7689 Persons encountering health services in other specified circumstances: Secondary | ICD-10-CM | POA: Diagnosis not present

## 2020-03-25 LAB — OB RESULTS CONSOLE ABO/RH: RH Type: POSITIVE

## 2020-03-25 LAB — OB RESULTS CONSOLE GC/CHLAMYDIA
Chlamydia: POSITIVE
Gonorrhea: NEGATIVE

## 2020-03-25 LAB — OB RESULTS CONSOLE HGB/HCT, BLOOD
HCT: 34 (ref 29–41)
Hemoglobin: 11.8

## 2020-03-25 LAB — OB RESULTS CONSOLE RUBELLA ANTIBODY, IGM: Rubella: IMMUNE

## 2020-03-25 LAB — OB RESULTS CONSOLE RPR: RPR: NONREACTIVE

## 2020-03-25 LAB — OB RESULTS CONSOLE PLATELET COUNT: Platelets: 271

## 2020-03-25 LAB — HEPATITIS C ANTIBODY: HCV Ab: NEGATIVE

## 2020-03-25 LAB — OB RESULTS CONSOLE HIV ANTIBODY (ROUTINE TESTING): HIV: NONREACTIVE

## 2020-03-25 LAB — OB RESULTS CONSOLE ANTIBODY SCREEN: Antibody Screen: NEGATIVE

## 2020-04-22 DIAGNOSIS — Z3402 Encounter for supervision of normal first pregnancy, second trimester: Secondary | ICD-10-CM | POA: Diagnosis not present

## 2020-04-22 DIAGNOSIS — O98812 Other maternal infectious and parasitic diseases complicating pregnancy, second trimester: Secondary | ICD-10-CM | POA: Diagnosis not present

## 2020-04-29 ENCOUNTER — Ambulatory Visit: Payer: 59

## 2020-04-29 ENCOUNTER — Other Ambulatory Visit: Payer: Self-pay | Admitting: Obstetrics and Gynecology

## 2020-04-29 ENCOUNTER — Other Ambulatory Visit: Payer: Self-pay | Admitting: Nurse Practitioner

## 2020-04-29 DIAGNOSIS — Z3689 Encounter for other specified antenatal screening: Secondary | ICD-10-CM

## 2020-05-20 ENCOUNTER — Encounter: Payer: Self-pay | Admitting: *Deleted

## 2020-05-25 ENCOUNTER — Other Ambulatory Visit: Payer: Self-pay | Admitting: *Deleted

## 2020-05-25 ENCOUNTER — Other Ambulatory Visit: Payer: Self-pay | Admitting: Obstetrics and Gynecology

## 2020-05-25 ENCOUNTER — Encounter: Payer: Self-pay | Admitting: *Deleted

## 2020-05-25 ENCOUNTER — Other Ambulatory Visit: Payer: Self-pay

## 2020-05-25 ENCOUNTER — Ambulatory Visit: Payer: 59 | Admitting: *Deleted

## 2020-05-25 ENCOUNTER — Ambulatory Visit: Payer: 59 | Attending: Obstetrics and Gynecology

## 2020-05-25 VITALS — BP 105/63 | HR 72

## 2020-05-25 DIAGNOSIS — O3412 Maternal care for benign tumor of corpus uteri, second trimester: Secondary | ICD-10-CM

## 2020-05-25 DIAGNOSIS — O43192 Other malformation of placenta, second trimester: Secondary | ICD-10-CM

## 2020-05-25 DIAGNOSIS — O283 Abnormal ultrasonic finding on antenatal screening of mother: Secondary | ICD-10-CM

## 2020-05-25 DIAGNOSIS — Z3689 Encounter for other specified antenatal screening: Secondary | ICD-10-CM

## 2020-05-25 DIAGNOSIS — O358XX0 Maternal care for other (suspected) fetal abnormality and damage, not applicable or unspecified: Secondary | ICD-10-CM

## 2020-05-25 DIAGNOSIS — D259 Leiomyoma of uterus, unspecified: Secondary | ICD-10-CM

## 2020-05-25 DIAGNOSIS — Z3A19 19 weeks gestation of pregnancy: Secondary | ICD-10-CM | POA: Diagnosis not present

## 2020-05-25 DIAGNOSIS — Z363 Encounter for antenatal screening for malformations: Secondary | ICD-10-CM | POA: Diagnosis not present

## 2020-06-05 DIAGNOSIS — U071 COVID-19: Secondary | ICD-10-CM

## 2020-06-05 HISTORY — DX: COVID-19: U07.1

## 2020-06-06 ENCOUNTER — Encounter (HOSPITAL_COMMUNITY): Payer: Self-pay | Admitting: Obstetrics and Gynecology

## 2020-06-06 ENCOUNTER — Other Ambulatory Visit: Payer: Self-pay

## 2020-06-06 ENCOUNTER — Inpatient Hospital Stay (HOSPITAL_COMMUNITY)
Admission: AD | Admit: 2020-06-06 | Discharge: 2020-06-07 | Disposition: A | Discharge status: Home / Self Care | Payer: 59 | Attending: Obstetrics and Gynecology | Admitting: Obstetrics and Gynecology

## 2020-06-06 DIAGNOSIS — O98512 Other viral diseases complicating pregnancy, second trimester: Secondary | ICD-10-CM | POA: Insufficient documentation

## 2020-06-06 DIAGNOSIS — R109 Unspecified abdominal pain: Secondary | ICD-10-CM | POA: Insufficient documentation

## 2020-06-06 DIAGNOSIS — O26892 Other specified pregnancy related conditions, second trimester: Secondary | ICD-10-CM | POA: Insufficient documentation

## 2020-06-06 DIAGNOSIS — O26899 Other specified pregnancy related conditions, unspecified trimester: Secondary | ICD-10-CM

## 2020-06-06 DIAGNOSIS — U071 COVID-19: Secondary | ICD-10-CM | POA: Insufficient documentation

## 2020-06-06 DIAGNOSIS — Z3A21 21 weeks gestation of pregnancy: Secondary | ICD-10-CM | POA: Insufficient documentation

## 2020-06-06 NOTE — MAU Note (Signed)
Pt stated she tested positive for covid yesterday. Started feeling upper and lower abd pain earlier today that has continued. Denies any vag bleeding or discharge.

## 2020-06-07 DIAGNOSIS — U071 COVID-19: Secondary | ICD-10-CM | POA: Diagnosis not present

## 2020-06-07 DIAGNOSIS — Z3A21 21 weeks gestation of pregnancy: Secondary | ICD-10-CM | POA: Diagnosis not present

## 2020-06-07 DIAGNOSIS — O98512 Other viral diseases complicating pregnancy, second trimester: Secondary | ICD-10-CM | POA: Diagnosis not present

## 2020-06-07 DIAGNOSIS — O26892 Other specified pregnancy related conditions, second trimester: Secondary | ICD-10-CM | POA: Diagnosis not present

## 2020-06-07 DIAGNOSIS — Z1159 Encounter for screening for other viral diseases: Secondary | ICD-10-CM | POA: Diagnosis not present

## 2020-06-07 DIAGNOSIS — R109 Unspecified abdominal pain: Secondary | ICD-10-CM | POA: Diagnosis not present

## 2020-06-07 LAB — URINALYSIS, ROUTINE W REFLEX MICROSCOPIC
Bacteria, UA: NONE SEEN
Bilirubin Urine: NEGATIVE
Glucose, UA: NEGATIVE mg/dL
Ketones, ur: NEGATIVE mg/dL
Leukocytes,Ua: NEGATIVE
Nitrite: NEGATIVE
Protein, ur: NEGATIVE mg/dL
Specific Gravity, Urine: 1.006 (ref 1.005–1.030)
pH: 7 (ref 5.0–8.0)

## 2020-06-07 LAB — SARS CORONAVIRUS 2 (TAT 6-24 HRS): SARS Coronavirus 2: POSITIVE — AB

## 2020-06-07 NOTE — MAU Note (Signed)
Pt reports upper abdominal pain and tightness has resolved. Lower abdominal pain with vaginal pressure continues. Uterus is soft to palpation. Pt reports soreness suprapubically while using external cardio to listen to FHR. FHR 155-162bpm. Toco applied.

## 2020-06-07 NOTE — MAU Provider Note (Signed)
History     Chief Complaint  Patient presents with   Abdominal Pain   35 yo G1P0 MBF @ 21 3/[redacted] weeks gestation here for lower abdominal pain since yesterday am. Pt reports (+) Covid test  Home test only. Pt also noted pelvic pressure. Denies vaginal bleeding or urinary sx. BM normal  OB History     Gravida  1   Para      Term      Preterm      AB      Living         SAB      IAB      Ectopic      Multiple      Live Births              Past Medical History:  Diagnosis Date   Alopecia    Anxiety    Chlamydia    COVID-19 06/05/2020   home test positive yesterday   Fibroadenoma of right breast    Headache    Migraines   HSV infection     History reviewed. No pertinent surgical history.  Family History  Problem Relation Age of Onset   Liver disease Mother     Social History   Tobacco Use   Smoking status: Never Smoker   Smokeless tobacco: Never Used  Vaping Use   Vaping Use: Never used  Substance Use Topics   Alcohol use: Yes    Comment: occasionally    Drug use: Never    Allergies: No Known Allergies  Medications Prior to Admission  Medication Sig Dispense Refill Last Dose   acetaminophen (TYLENOL) 500 MG tablet Take 1,000 mg by mouth every 6 (six) hours as needed.   06/07/2020 at 1430   Prenatal Vit-Fe Fumarate-FA (PRENATAL MULTIVITAMIN) TABS tablet Take 1 tablet by mouth daily at 12 noon.   Past Week at Unknown time   Magnesium 200 MG TABS Take one tablet by mouth with evening meals 30 tablet 2    traZODone (DESYREL) 50 MG tablet Take 1 tablet (50 mg total) by mouth at bedtime. (Patient not taking: Reported on 05/25/2020) 30 tablet 0    Ubrogepant (UBRELVY) 50 MG TABS Take 1 tablet by mouth daily as needed. At onset of headache (Patient not taking: Reported on 05/25/2020) 10 tablet 2      Physical Exam   Blood pressure 103/69, pulse (!) 106, temperature 98 F (36.7 C), resp. rate 18, last menstrual period 01/09/2020, SpO2 100  %.  General appearance: alert, cooperative and no distress Abdomen:  gravid nontender Pelvic: cervix normal in appearance, external genitalia normal and vagina scant white d/c no odor  cervix long closed /firm posterior Uterus gravid (+) FHR Extremities: no edema, redness or tenderness in the calves or thighs     IMP: abdominal pain in pregnancy Covid (+) home test( not recognized by system) IUP @ 21 3/7 wk P)  u/a, ucx.   Official Covid test MDM  Addendum  U/a. Small heme. Ucx sent Pt reassured Covid 19 test D/c home Marvene Staff, MD 1:11 AM 06/07/2020

## 2020-06-22 ENCOUNTER — Ambulatory Visit: Payer: 59 | Attending: Obstetrics

## 2020-06-22 ENCOUNTER — Ambulatory Visit: Payer: 59

## 2020-07-01 DIAGNOSIS — O98812 Other maternal infectious and parasitic diseases complicating pregnancy, second trimester: Secondary | ICD-10-CM | POA: Diagnosis not present

## 2020-07-01 DIAGNOSIS — Z3402 Encounter for supervision of normal first pregnancy, second trimester: Secondary | ICD-10-CM | POA: Diagnosis not present

## 2020-07-01 DIAGNOSIS — O3412 Maternal care for benign tumor of corpus uteri, second trimester: Secondary | ICD-10-CM | POA: Diagnosis not present

## 2020-07-01 DIAGNOSIS — A609 Anogenital herpesviral infection, unspecified: Secondary | ICD-10-CM | POA: Diagnosis not present

## 2020-07-01 DIAGNOSIS — Z362 Encounter for other antenatal screening follow-up: Secondary | ICD-10-CM | POA: Diagnosis not present

## 2020-07-19 DIAGNOSIS — Z3403 Encounter for supervision of normal first pregnancy, third trimester: Secondary | ICD-10-CM | POA: Diagnosis not present

## 2020-07-19 DIAGNOSIS — O98813 Other maternal infectious and parasitic diseases complicating pregnancy, third trimester: Secondary | ICD-10-CM | POA: Diagnosis not present

## 2020-07-19 DIAGNOSIS — O3413 Maternal care for benign tumor of corpus uteri, third trimester: Secondary | ICD-10-CM | POA: Diagnosis not present

## 2020-07-29 ENCOUNTER — Other Ambulatory Visit: Payer: Self-pay

## 2020-07-29 ENCOUNTER — Other Ambulatory Visit: Payer: 59

## 2020-07-29 ENCOUNTER — Encounter: Payer: Self-pay | Admitting: Certified Nurse Midwife

## 2020-07-29 ENCOUNTER — Ambulatory Visit (INDEPENDENT_AMBULATORY_CARE_PROVIDER_SITE_OTHER): Payer: 59 | Admitting: Certified Nurse Midwife

## 2020-07-29 VITALS — BP 113/72 | HR 87 | Wt 142.5 lb

## 2020-07-29 DIAGNOSIS — D259 Leiomyoma of uterus, unspecified: Secondary | ICD-10-CM

## 2020-07-29 DIAGNOSIS — Z113 Encounter for screening for infections with a predominantly sexual mode of transmission: Secondary | ICD-10-CM

## 2020-07-29 DIAGNOSIS — Z3A28 28 weeks gestation of pregnancy: Secondary | ICD-10-CM

## 2020-07-29 DIAGNOSIS — Z3403 Encounter for supervision of normal first pregnancy, third trimester: Secondary | ICD-10-CM

## 2020-07-29 DIAGNOSIS — Z23 Encounter for immunization: Secondary | ICD-10-CM | POA: Diagnosis not present

## 2020-07-29 DIAGNOSIS — B009 Herpesviral infection, unspecified: Secondary | ICD-10-CM

## 2020-07-29 DIAGNOSIS — O3413 Maternal care for benign tumor of corpus uteri, third trimester: Secondary | ICD-10-CM

## 2020-07-29 DIAGNOSIS — Z130 Encounter for screening for diseases of the blood and blood-forming organs and certain disorders involving the immune mechanism: Secondary | ICD-10-CM

## 2020-07-29 DIAGNOSIS — Z8619 Personal history of other infectious and parasitic diseases: Secondary | ICD-10-CM

## 2020-07-29 DIAGNOSIS — O3410 Maternal care for benign tumor of corpus uteri, unspecified trimester: Secondary | ICD-10-CM

## 2020-07-29 DIAGNOSIS — Z131 Encounter for screening for diabetes mellitus: Secondary | ICD-10-CM

## 2020-07-29 DIAGNOSIS — O98513 Other viral diseases complicating pregnancy, third trimester: Secondary | ICD-10-CM

## 2020-07-29 HISTORY — DX: Leiomyoma of uterus, unspecified: D25.9

## 2020-07-29 LAB — POCT URINALYSIS DIPSTICK OB
Bilirubin, UA: NEGATIVE
Blood, UA: NEGATIVE
Glucose, UA: NEGATIVE
Ketones, UA: NEGATIVE
Leukocytes, UA: NEGATIVE
Nitrite, UA: NEGATIVE
POC,PROTEIN,UA: NEGATIVE
Spec Grav, UA: 1.01 (ref 1.010–1.025)
Urobilinogen, UA: 0.2 E.U./dL
pH, UA: 7.5 (ref 5.0–8.0)

## 2020-07-29 MED ORDER — TETANUS-DIPHTH-ACELL PERTUSSIS 5-2.5-18.5 LF-MCG/0.5 IM SUSY
0.5000 mL | PREFILLED_SYRINGE | Freq: Once | INTRAMUSCULAR | Status: AC
  Administered 2020-07-29: 11:00:00 0.5 mL via INTRAMUSCULAR

## 2020-07-29 NOTE — Progress Notes (Signed)
TRANSFER IN OB HISTORY AND PHYSICAL  SUBJECTIVE:       Jasmin Jackson is a 35 y.o. G1P0 female, Patient's last menstrual period was 01/09/2020., Estimated Date of Delivery: 10/15/20, [redacted]w[redacted]d, presents today for Transition of Prenatal Care. EPIC data migration from outside records is accomplished today.  Patient transferring from [facility] (Dr. Garwin Brothers). 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   No questions or concerns. Accompanied by spouse, Herbie Baltimore.   Denies difficulty breathing or respiratory distress, chest pain, abdominal pain, vaginal bleeding, dysuria, and leg pain or swelling.   Gynecologic History  Patient's last menstrual period was 01/09/2020.  Contraception: none  Last Pap: 12/2019. Results were: Neg/Neg  Obstetric History OB History  Gravida Para Term Preterm AB Living  1            SAB IAB Ectopic Multiple Live Births               # Outcome Date GA Lbr Len/2nd Weight Sex Delivery Anes PTL Lv  1 Current             Past Medical History:  Diagnosis Date   Alopecia    Anxiety    Chlamydia    COVID-19 06/05/2020   home test positive yesterday   Fibroadenoma of right breast    Headache    Migraines   HSV infection     History reviewed. No pertinent surgical history.  Current Outpatient Medications on File Prior to Visit  Medication Sig Dispense Refill   acetaminophen (TYLENOL) 500 MG tablet Take 1,000 mg by mouth every 6 (six) hours as needed.     Magnesium 200 MG TABS Take one tablet by mouth with evening meals 30 tablet 2   Prenatal Vit-Fe Fumarate-FA (PRENATAL MULTIVITAMIN) TABS tablet Take 1 tablet by mouth daily at 12 noon.     No current facility-administered medications on file prior to visit.    No Known Allergies  Social History   Socioeconomic History   Marital status: Married    Spouse name: Not on file   Number of children: Not on file   Years of education: Not on file   Highest education level: Not on file  Occupational  History   Not on file  Tobacco Use   Smoking status: Never   Smokeless tobacco: Never  Vaping Use   Vaping Use: Never used  Substance and Sexual Activity   Alcohol use: Not Currently    Comment: occasionally    Drug use: Never   Sexual activity: Yes  Other Topics Concern   Not on file  Social History Narrative   Not on file   Social Determinants of Health   Financial Resource Strain: Not on file  Food Insecurity: Not on file  Transportation Needs: Not on file  Physical Activity: Not on file  Stress: Not on file  Social Connections: Not on file  Intimate Partner Violence: Not on file    Family History  Problem Relation Age of Onset   Liver disease Mother    Heart attack Father     The following portions of the patient's history were reviewed and updated as appropriate: allergies, current medications, past OB history, past medical history, past surgical history, past family history, past social history, and problem list.  Review of Systems:  ROS negative except as noted above. Information obtained from patient, spouse present.   OBJECTIVE:  BP 113/72   Pulse 87   Wt 142 lb 8 oz (64.6 kg)  LMP 01/09/2020   BMI 22.19 kg/m   Initial Physical Exam (New OB)  GENERAL APPEARANCE: alert, well appearing, in no apparent distress  HEAD: normocephalic, atraumatic  MOUTH: deferred due to COVID-19 pandemic  THYROID: no thyromegaly or masses present  BREASTS: deferred, no complaints  LUNGS: clear to auscultation, no wheezes, rales or rhonchi, symmetric air entry  HEART: regular rate and rhythm, no murmurs  ABDOMEN: soft, nontender, nondistended, no abnormal masses, no epigastric pain, fundus soft, nontender 29 cm, and FHT present  EXTREMITIES: no redness or tenderness in the calves or thighs, no edema  SKIN: normal coloration and turgor, no rashes  PELVIC EXAM: Deferred, no complaints  ASSESSMENT:  1. Encounter for supervision of normal first pregnancy in  third trimester  - CBC; Future - Glucose, 1 hour gestational; Future - RPR; Future  2. [redacted] weeks gestation of pregnancy   3. Screening for diabetes mellitus   4. Screening examination for venereal disease   5. Screening for deficiency anemia   6. Uterine fibroid in pregnancy   7. Herpes simplex virus type 2 (HSV-2) infection affecting pregnancy in third trimester   8. History of chlamydia   Other orders - Tdap (BOOSTRIX) injection 0.5 mL - OB RESULTS CONSOLE RPR - OB RESULTS CONSOLE HIV antibody - OB RESULTS CONSOLE Rubella Antibody - OB RESULTS CONSOLE ABO/Rh - OB RESULTS CONSOLE Antibody Screen - OB RESULTS CONSOLE Hemoglobin and hematocrit, blood - OB RESULTS CONSOLE PLATELET COUNT - Hepatitis C antibody - OB RESULTS CONSOLE GC/Chlamydia   PLAN: Prenatal care New OB counseling: The patient has been given an overview regarding routine prenatal care. Recommendations regarding diet, weight gain, and exercise in pregnancy were given. Prenatal testing, optional genetic testing, and ultrasound use in pregnancy were reviewed.  Benefits of Breast Feeding were discussed and education completed, see chart.  FMLA and English as a second language teacher discussed and signed.  Third trimester labs completed; TDaP given and BTC signed.   See orders

## 2020-07-29 NOTE — Patient Instructions (Addendum)
Tdap (Tetanus, Diphtheria, Pertussis) Vaccine: What You Need to Know 1. Why get vaccinated? Tdap vaccine can prevent tetanus, diphtheria, and pertussis. Diphtheria and pertussis spread from person to person. Tetanus enters the body through cuts or wounds. TETANUS (T) causes painful stiffening of the muscles. Tetanus can lead to serious health problems, including being unable to open the mouth, having trouble swallowing and breathing, or death. DIPHTHERIA (D) can lead to difficulty breathing, heart failure, paralysis, or death. PERTUSSIS (aP), also known as "whooping cough," can cause uncontrollable, violent coughing that makes it hard to breathe, eat, or drink. Pertussis can be extremely serious especially in babies and young children, causing pneumonia, convulsions, brain damage, or death. In teens and adults, it can cause weight loss, loss of bladder control, passing out, and rib fractures from severe coughing. 2. Tdap vaccine Tdap is only for children 7 years and older, adolescents, and adults.  Adolescents should receive a single dose of Tdap, preferably at age 29 or 72 years. Pregnant people should get a dose of Tdap during every pregnancy, preferably during the early part of the third trimester, to help protect the newborn from pertussis. Infants are most at risk for severe, life-threatening complications frompertussis. Adults who have never received Tdap should get a dose of Tdap. Also, adults should receive a booster dose of either Tdap or Td (a different vaccine that protects against tetanus and diphtheria but not pertussis) every 10 years, or after 5 years in the case of a severe or dirty wound or burn. Tdap may be given at the same time as other vaccines. 3. Talk with your health care provider Tell your vaccine provider if the person getting the vaccine: Has had an allergic reaction after a previous dose of any vaccine that protects against tetanus, diphtheria, or pertussis, or has any  severe, life-threatening allergies Has had a coma, decreased level of consciousness, or prolonged seizures within 7 days after a previous dose of any pertussis vaccine (DTP, DTaP, or Tdap) Has seizures or another nervous system problem Has ever had Guillain-Barr Syndrome (also called "GBS") Has had severe pain or swelling after a previous dose of any vaccine that protects against tetanus or diphtheria In some cases, your health care provider may decide to postpone Tdapvaccination until a future visit. People with minor illnesses, such as a cold, may be vaccinated. People who are moderately or severely ill should usually wait until they recover beforegetting Tdap vaccine.  Your health care provider can give you more information. 4. Risks of a vaccine reaction Pain, redness, or swelling where the shot was given, mild fever, headache, feeling tired, and nausea, vomiting, diarrhea, or stomachache sometimes happen after Tdap vaccination. People sometimes faint after medical procedures, including vaccination. Tellyour provider if you feel dizzy or have vision changes or ringing in the ears.  As with any medicine, there is a very remote chance of a vaccine causing asevere allergic reaction, other serious injury, or death. 5. What if there is a serious problem? An allergic reaction could occur after the vaccinated person leaves the clinic. If you see signs of a severe allergic reaction (hives, swelling of the face and throat, difficulty breathing, a fast heartbeat, dizziness, or weakness), call [id number] get the person to the nearest hospital. For other signs that concern you, call your health care provider.  Adverse reactions should be reported to the Vaccine Adverse Event Reporting System (VAERS). Your health care provider will usually file this report, or you can do it yourself. Visit the  VAERS website at www.vaers.SamedayNews.es or call 443 014 8683. VAERS is only for reporting reactions, and VAERS staff  members do not give medical advice. 6. The National Vaccine Injury Compensation Program The [employer] Vaccine Injury Compensation Program (VICP) is a federal program that was created to compensate people who may have been injured by certain vaccines. Claims regarding alleged injury or death due to vaccination have a time limit for filing, which may be as short as two years. Visit the VICP website at GoldCloset.com.ee or call 432-021-8858to learn about the program and about filing a claim. 7. How can I learn more? Ask your health care provider. Call your local or state health department. Visit the website of the Food and Drug Administration (FDA) for vaccine package inserts and additional information at TraderRating.uy. Contact the Centers for Disease Control and Prevention (CDC): Call 234 288 2741 (1-800-CDC-INFO) or Visit CDC's website at http://hunter.com/. Vaccine Information Statement Tdap (Tetanus, Diphtheria, Pertussis) Vaccine(09/05/2019) This information is not intended to replace advice given to you by your health care provider. Make sure you discuss any questions you have with your healthcare provider. Document Revised: 10/01/2019 Document Reviewed: 10/01/2019 Elsevier Patient Education  2022 [place] Breast self-awareness is knowing how your breasts look and feel. Doing breast self-awareness is important. It allows you to catch a breast problem early while it is still small and can be treated. All women should do breast self-awareness, including women who have had breast implants. Tell your doctorif you notice a change in your breasts. What you need: A mirror. A well-lit room. How to do a breast self-exam A breast self-exam is one way to learn what is normal for your breasts and tocheck for changes. To do a breast self-exam: Look for changes  Take off all the clothes above your waist. Stand in front of a  mirror in a room with good lighting. Put your hands on your hips. Push your hands down. Look at your breasts and nipples in the mirror to see if one breast or nipple looks different from the other. Check to see if: The shape of one breast is different. The size of one breast is different. There are wrinkles, dips, and bumps in one breast and not the other. Look at each breast for changes in the skin, such as: Redness. Scaly areas. Look for changes in your nipples, such as: Liquid around the nipples. Bleeding. Dimpling. Redness. A change in where the nipples are.  Feel for changes  Lie on your back on the floor. Feel each breast. To do this, follow these steps: Pick a breast to feel. Put the arm closest to that breast above your head. Use your other arm to feel the nipple area of your breast. Feel the area with the pads of your three middle fingers by making small circles with your fingers. For the first circle, press lightly. For the second circle, press harder. For the third circle, press even harder. Keep making circles with your fingers at the different pressures as you move down your breast. Stop when you feel your ribs. Move your fingers a little toward the center of your body. Start making circles with your fingers again, this time going up until you reach your collarbone. Keep making up-and-down circles until you reach your armpit. Remember to keep using the three pressures. Feel the other breast in the same way. Sit or stand in the tub or shower. With soapy water on your skin, feel each breast the same way you did in step  2 when you were lying on the floor.  Write down what you find Writing down what you find can help you remember what to tell your doctor. Write down: What is normal for each breast. Any changes you find in each breast, including: The kind of changes you find. Whether you have pain. Size and location of any lumps. When you last had your menstrual  period. General tips Check your breasts every month. If you are breastfeeding, the best time to check your breasts is after you feed your baby or after you use a breast pump. If you get menstrual periods, the best time to check your breasts is 5-7 days after your menstrual period is over. With time, you will become comfortable with the self-exam, and you will begin to know if there are changes in your breasts. Contact a doctor if you: See a change in the shape or size of your breasts or nipples. See a change in the skin of your breast or nipples, such as red or scaly skin. Have fluid coming from your nipples that is not normal. Find a lump or thick area that was not there before. Have pain in your breasts. Have any concerns about your breast health. Summary Breast self-awareness includes looking for changes in your breasts, as well as feeling for changes within your breasts. Breast self-awareness should be done in front of a mirror in a well-lit room. You should check your breasts every month. If you get menstrual periods, the best time to check your breasts is 5-7 days after your menstrual period is over. Let your doctor know of any changes you see in your breasts, including changes in size, changes on the skin, pain or tenderness, or fluid from your nipples that is not normal. This information is not intended to replace advice given to you by your health care provider. Make sure you discuss any questions you have with your healthcare provider. Document Revised: 09/04/2017 Document Reviewed: 09/04/2017 Elsevier Patient Education  [place]. Common Medications Safe in Pregnancy  Acne:      Constipation:  Benzoyl Peroxide     Colace  Clindamycin      Dulcolax Suppository  Topica Erythromycin     Fibercon  Salicylic Acid      Metamucil         Miralax AVOID:        Senakot   Accutane    Cough:  Retin-A       Cough Drops  Tetracycline      Phenergan w/ Codeine if  Rx  Minocycline      Robitussin (Plain & DM)  Antibiotics:     Crabs/Lice:  Ceclor       RID  Cephalosporins    AVOID:  E-Mycins      Kwell  Keflex  Macrobid/Macrodantin   Diarrhea:  Penicillin      Kao-Pectate  Zithromax      Imodium AD         PUSH FLUIDS AVOID:       Cipro     Fever:  Tetracycline      Tylenol (Regular or Extra  Minocycline       Strength)  Levaquin      Extra Strength-Do not          Exceed 8 tabs/24 hrs Caffeine:        <227m/day (equiv. To 1 cup of coffee or  approx. 3 12 oz sodas)  Gas: Cold/Hayfever:       Gas-X  Benadryl      Mylicon  Claritin       Phazyme  **Claritin-D        Chlor-Trimeton    Headaches:  Dimetapp      ASA-Free Excedrin  Drixoral-Non-Drowsy     Cold Compress  Mucinex (Guaifenasin)     Tylenol (Regular or Extra  Sudafed/Sudafed-12 Hour     Strength)  **Sudafed PE Pseudoephedrine   Tylenol Cold & Sinus     Vicks Vapor Rub  Zyrtec  **AVOID if Problems With Blood Pressure         Heartburn: Avoid lying down for at least 1 hour after meals  Aciphex      Maalox     Rash:  Milk of Magnesia     Benadryl    Mylanta       1% Hydrocortisone Cream  Pepcid  Pepcid Complete   Sleep Aids:  Prevacid      Ambien   Prilosec       Benadryl  Rolaids       Chamomile Tea  Tums (Limit 4/day)     Unisom         Tylenol PM         Warm milk-add vanilla or  Hemorrhoids:       Sugar for taste  Anusol/Anusol H.C.  (RX: Analapram 2.5%)  Sugar Substitutes:  Hydrocortisone OTC     Ok in moderation  Preparation H      Tucks        Vaseline lotion applied to tissue with wiping    Herpes:     Throat:  Acyclovir      Oragel  Famvir  Valtrex     Vaccines:         Flu Shot Leg Cramps:       *Gardasil  Benadryl      Hepatitis A         Hepatitis B Nasal Spray:       Pneumovax  Saline Nasal Spray     Polio Booster         Tetanus Nausea:       Tuberculosis test or PPD  Vitamin B6 25 mg  TID   AVOID:    Dramamine      *Gardasil  Emetrol       Live Poliovirus  Ginger Root 250 mg QID    MMR (measles, mumps &  High Complex Carbs @ Bedtime    rebella)  Sea Bands-Accupressure    Varicella (Chickenpox)  Unisom 1/2 tab TID     *No known complications           If received before Pain:         Known pregnancy;   Darvocet       Resume series after  Lortab        Delivery  Percocet    Yeast:   Tramadol      Femstat  Tylenol 3      Gyne-lotrimin  Ultram       Monistat  Vicodin           MISC:         All Sunscreens           Hair Coloring/highlights          Insect Repellant's          (Including DEET)         Mystic Tans

## 2020-07-29 NOTE — Progress Notes (Signed)
Transfer OB-Pt [redacted]w[redacted]d for prenatal care with new provider. 1 hour glucose, BTC and Tdap completed.

## 2020-07-30 ENCOUNTER — Other Ambulatory Visit: Payer: Self-pay

## 2020-07-30 LAB — CBC
Hematocrit: 32.1 % — ABNORMAL LOW (ref 34.0–46.6)
Hemoglobin: 10.9 g/dL — ABNORMAL LOW (ref 11.1–15.9)
MCH: 29.6 pg (ref 26.6–33.0)
MCHC: 34 g/dL (ref 31.5–35.7)
MCV: 87 fL (ref 79–97)
Platelets: 169 10*3/uL (ref 150–450)
RBC: 3.68 x10E6/uL — ABNORMAL LOW (ref 3.77–5.28)
RDW: 14.6 % (ref 11.7–15.4)
WBC: 3.7 10*3/uL (ref 3.4–10.8)

## 2020-07-30 LAB — RPR: RPR Ser Ql: NONREACTIVE

## 2020-07-30 LAB — GLUCOSE, 1 HOUR GESTATIONAL: Gestational Diabetes Screen: 100 mg/dL (ref 65–139)

## 2020-07-30 NOTE — Telephone Encounter (Signed)
Spoke to pt concerning hx of + chlamydia test. Pt stated she was + during her first OB visit; she was treated and retested; results were negative. AC is aware.

## 2020-08-11 NOTE — Patient Instructions (Signed)
Breastfeeding and Breast Care It is normal to have some problems when you start to breastfeed your new baby. But there are things that you can do to take care of yourself and help prevent problems. This includes keeping your breasts healthy and making sure that your baby's mouth attaches (latches) properly to your nipple for feedings. Work with your doctor or breastfeeding specialist to find what works best foryou. How does self-care benefit me? If you keep your breasts healthy and you let your baby attach to your nipples in the right way, you will avoid these problems: Cracked or sore nipples. Breasts becoming overfilled with milk. Plugged milk ducts. Low milk supply. Breast swelling or infection. How does self-care benefit my baby? By preventing problems with your breasts, you will ensure that your baby willfeed well and will gain the right amount of weight. What actions can I take to care for myself during breastfeeding? Best ways to breastfeed Always make sure that your baby latches properly to breastfeed. Make sure that your baby is in a proper position. Try different breastfeeding positions to find one that works best for you and your baby. Breastfeed when you feel like you need to make your breasts less full or when your baby shows signs of hunger. This is called "breastfeeding on demand." Do not delay feedings. Try to relax when it is time to feed your baby. This helps your body release milk from your breast. To help increase milk flow, do these things before feeding: Remove a small amount of milk from your breast. Use a pump or squeeze with your hand. Apply warm, moist heat to your breast. Do this in the shower or use hand towels soaked with warm water. Massage your breasts. Do this when you are breastfeeding as well. Caring for your breasts     To help your breasts stay healthy and keep them from getting too dry: Avoid using soap on your nipples. Let your nipples air-dry for  3-4 minutes after each feeding. Do not use things like a hair dryer to dry your breasts. This can make the skin dry and will cause irritation and pain. Use only cotton bra pads to soak up breast milk that leaks. Change the pads if they become soaked with milk. If you use bra pads that can be thrown away, change them often. Put some lanolin on your nipples after breastfeeding. Pure lanolin does not need to be washed off your nipple before you feed your baby again. Pure lanolin is not harmful to your baby. Rub some breast milk into your nipples: Use your hand to squeeze out a few drops of breast milk. Gently massage the milk into your nipples. Let your nipples air-dry. Wear a supportive nursing bra. Avoid wearing: Tight clothing. Underwire bras or bras that put pressure on your breasts. Use ice to help relieve pain or swelling of your breasts: Put ice in a plastic bag. Place a towel between your skin and the bag. Leave the ice on for 20 minutes, 2-3 times a day. Follow these instructions at home: Drink enough fluid to keep your pee (urine) pale yellow. Get plenty of rest. Sleep when your baby sleeps. Talk to your doctor or breastfeeding specialist before taking any herbal supplements. Eat a balanced diet. This includes fruits, vegetables, whole grains, lean proteins, and dairy or dairy alternatives Contact a health care provider if: You have nipple pain. You have cracking or soreness in your nipples that lasts longer than 1 week. Your breasts are  overfilled with milk, and this lasts longer than 48 hours. You have a fever. You have pus-like fluid coming from your nipple. You have redness, a rash, swelling, itching, or burning on your breast. Your baby does not gain weight. Your baby loses weight. Your baby is not feeding regularly or is very sleepy and lacks energy. Summary There are things that you can do to take care of yourself and help prevent many common breastfeeding problems. Always  make sure that your baby's mouth attaches (latches) to your nipple properly to breastfeed. Keep your nipples from getting too dry, drink plenty of fluid, and get plenty of rest. Feed on demand. Do not delay feedings. This information is not intended to replace advice given to you by your health care provider. Make sure you discuss any questions you have with your health care provider. Document Revised: 07/08/2019 Document Reviewed: 07/08/2019 Elsevier Patient Education  2022 Elsevier Inc.    Common Medications Safe in Pregnancy  Acne:      Constipation:  Benzoyl Peroxide     Colace  Clindamycin      Dulcolax Suppository  Topica Erythromycin     Fibercon  Salicylic Acid      Metamucil         Miralax AVOID:        Senakot   Accutane    Cough:  Retin-A       Cough Drops  Tetracycline      Phenergan w/ Codeine if Rx  Minocycline      Robitussin (Plain & DM)  Antibiotics:     Crabs/Lice:  Ceclor       RID  Cephalosporins    AVOID:  E-Mycins      Kwell  Keflex  Macrobid/Macrodantin   Diarrhea:  Penicillin      Kao-Pectate  Zithromax      Imodium AD         PUSH FLUIDS AVOID:       Cipro     Fever:  Tetracycline      Tylenol (Regular or Extra  Minocycline       Strength)  Levaquin      Extra Strength-Do not          Exceed 8 tabs/24 hrs Caffeine:        <200mg/day (equiv. To 1 cup of coffee or  approx. 3 12 oz sodas)         Gas: Cold/Hayfever:       Gas-X  Benadryl      Mylicon  Claritin       Phazyme  **Claritin-D        Chlor-Trimeton    Headaches:  Dimetapp      ASA-Free Excedrin  Drixoral-Non-Drowsy     Cold Compress  Mucinex (Guaifenasin)     Tylenol (Regular or Extra  Sudafed/Sudafed-12 Hour     Strength)  **Sudafed PE Pseudoephedrine   Tylenol Cold & Sinus     Vicks Vapor Rub  Zyrtec  **AVOID if Problems With Blood Pressure         Heartburn: Avoid lying down for at least 1 hour after meals  Aciphex      Maalox     Rash:  Milk of  Magnesia     Benadryl    Mylanta       1% Hydrocortisone Cream  Pepcid  Pepcid Complete   Sleep Aids:  Prevacid      Ambien   Prilosec       Benadryl    Rolaids       Chamomile Tea  Tums (Limit 4/day)     Unisom         Tylenol PM         Warm milk-add vanilla or  Hemorrhoids:       Sugar for taste  Anusol/Anusol H.C.  (RX: Analapram 2.5%)  Sugar Substitutes:  Hydrocortisone OTC     Ok in moderation  Preparation H      Tucks        Vaseline lotion applied to tissue with wiping    Herpes:     Throat:  Acyclovir      Oragel  Famvir  Valtrex     Vaccines:         Flu Shot Leg Cramps:       *Gardasil  Benadryl      Hepatitis A         Hepatitis B Nasal Spray:       Pneumovax  Saline Nasal Spray     Polio Booster         Tetanus Nausea:       Tuberculosis test or PPD  Vitamin B6 25 mg TID   AVOID:    Dramamine      *Gardasil  Emetrol       Live Poliovirus  Ginger Root 250 mg QID    MMR (measles, mumps &  High Complex Carbs @ Bedtime    rebella)  Sea Bands-Accupressure    Varicella (Chickenpox)  Unisom 1/2 tab TID     *No known complications           If received before Pain:         Known pregnancy;   Darvocet       Resume series after  Lortab        Delivery  Percocet    Yeast:   Tramadol      Femstat  Tylenol 3      Gyne-lotrimin  Ultram       Monistat  Vicodin           MISC:         All Sunscreens           Hair Coloring/highlights          Insect Repellant's          (Including DEET)         Mystic Tans

## 2020-08-11 NOTE — Progress Notes (Signed)
OB-Pt present for routine prenatal care. Pt stated having vaginal itching.

## 2020-08-12 ENCOUNTER — Ambulatory Visit (INDEPENDENT_AMBULATORY_CARE_PROVIDER_SITE_OTHER): Payer: 59 | Admitting: Obstetrics and Gynecology

## 2020-08-12 ENCOUNTER — Other Ambulatory Visit: Payer: Self-pay

## 2020-08-12 ENCOUNTER — Encounter: Payer: Self-pay | Admitting: Obstetrics and Gynecology

## 2020-08-12 VITALS — BP 103/68 | HR 102 | Wt 144.0 lb

## 2020-08-12 DIAGNOSIS — O368390 Maternal care for abnormalities of the fetal heart rate or rhythm, unspecified trimester, not applicable or unspecified: Secondary | ICD-10-CM

## 2020-08-12 DIAGNOSIS — Z3403 Encounter for supervision of normal first pregnancy, third trimester: Secondary | ICD-10-CM

## 2020-08-12 DIAGNOSIS — Z3A30 30 weeks gestation of pregnancy: Secondary | ICD-10-CM

## 2020-08-12 LAB — POCT URINALYSIS DIPSTICK OB
Bilirubin, UA: NEGATIVE
Blood, UA: NEGATIVE
Glucose, UA: NEGATIVE
Ketones, UA: NEGATIVE
Leukocytes, UA: NEGATIVE
Nitrite, UA: NEGATIVE
Spec Grav, UA: 1.01 (ref 1.010–1.025)
Urobilinogen, UA: 0.2 E.U./dL
pH, UA: 7.5 (ref 5.0–8.0)

## 2020-08-12 NOTE — Progress Notes (Signed)
ROB: Patient doing well with no complaints. Given overview of practice and MD care. Answered questions . Fetus with possible arrhythmia noted on Doppler today. Patient denies any prior history of this. Will continue to monitor, and refer to MFM if needed. Reports h/o normal anatomy scan. Normal 28 week labs. Breastfeeding previously discussed. RTC in 2 weeks.

## 2020-08-26 ENCOUNTER — Encounter: Payer: 59 | Admitting: Nurse Practitioner

## 2020-08-27 ENCOUNTER — Ambulatory Visit (INDEPENDENT_AMBULATORY_CARE_PROVIDER_SITE_OTHER): Payer: 59 | Admitting: Obstetrics and Gynecology

## 2020-08-27 ENCOUNTER — Encounter: Payer: Self-pay | Admitting: Obstetrics and Gynecology

## 2020-08-27 ENCOUNTER — Other Ambulatory Visit: Payer: Self-pay

## 2020-08-27 VITALS — BP 109/70 | HR 105 | Wt 146.2 lb

## 2020-08-27 DIAGNOSIS — Z3403 Encounter for supervision of normal first pregnancy, third trimester: Secondary | ICD-10-CM

## 2020-08-27 DIAGNOSIS — Z3A33 33 weeks gestation of pregnancy: Secondary | ICD-10-CM

## 2020-08-27 NOTE — Progress Notes (Signed)
ROB: Self treated with Monistat but mostly on the outside.  Her itching resolved but has now returned.  Advised use of Monistat again but both inside and outside.  If itching continues consider further testing.  Patient denies contractions.  Reports daily fetal movement.  Not taking vitamins daily-discussed this with possible change in vitamin.  Slightly lagging fundal heights- if this continues consider ultrasound.

## 2020-08-30 DIAGNOSIS — F411 Generalized anxiety disorder: Secondary | ICD-10-CM | POA: Diagnosis not present

## 2020-08-30 DIAGNOSIS — F330 Major depressive disorder, recurrent, mild: Secondary | ICD-10-CM | POA: Diagnosis not present

## 2020-09-03 ENCOUNTER — Telehealth: Payer: Self-pay | Admitting: Obstetrics and Gynecology

## 2020-09-03 NOTE — Telephone Encounter (Signed)
Patient called and wanted to know the status on her RX for a breast pump

## 2020-09-08 DIAGNOSIS — Z3482 Encounter for supervision of other normal pregnancy, second trimester: Secondary | ICD-10-CM | POA: Diagnosis not present

## 2020-09-08 DIAGNOSIS — Z3483 Encounter for supervision of other normal pregnancy, third trimester: Secondary | ICD-10-CM | POA: Diagnosis not present

## 2020-09-14 DIAGNOSIS — F411 Generalized anxiety disorder: Secondary | ICD-10-CM | POA: Diagnosis not present

## 2020-09-14 DIAGNOSIS — F330 Major depressive disorder, recurrent, mild: Secondary | ICD-10-CM | POA: Diagnosis not present

## 2020-09-16 ENCOUNTER — Ambulatory Visit (INDEPENDENT_AMBULATORY_CARE_PROVIDER_SITE_OTHER): Payer: 59 | Admitting: Obstetrics and Gynecology

## 2020-09-16 ENCOUNTER — Other Ambulatory Visit: Payer: Self-pay

## 2020-09-16 ENCOUNTER — Encounter: Payer: Self-pay | Admitting: Obstetrics and Gynecology

## 2020-09-16 VITALS — BP 105/70 | HR 94 | Wt 149.2 lb

## 2020-09-16 DIAGNOSIS — Z3483 Encounter for supervision of other normal pregnancy, third trimester: Secondary | ICD-10-CM | POA: Diagnosis not present

## 2020-09-16 DIAGNOSIS — B009 Herpesviral infection, unspecified: Secondary | ICD-10-CM

## 2020-09-16 DIAGNOSIS — Z3A35 35 weeks gestation of pregnancy: Secondary | ICD-10-CM

## 2020-09-16 DIAGNOSIS — Z3685 Encounter for antenatal screening for Streptococcus B: Secondary | ICD-10-CM

## 2020-09-16 DIAGNOSIS — O98513 Other viral diseases complicating pregnancy, third trimester: Secondary | ICD-10-CM

## 2020-09-16 LAB — POCT URINALYSIS DIPSTICK OB
Bilirubin, UA: NEGATIVE
Blood, UA: NEGATIVE
Glucose, UA: NEGATIVE
Ketones, UA: NEGATIVE
Leukocytes, UA: NEGATIVE
Nitrite, UA: NEGATIVE
POC,PROTEIN,UA: NEGATIVE
Spec Grav, UA: 1.01 (ref 1.010–1.025)
Urobilinogen, UA: 0.2 E.U./dL
pH, UA: 7.5 (ref 5.0–8.0)

## 2020-09-16 MED ORDER — VALACYCLOVIR HCL 500 MG PO TABS: 500.0000 mg | ORAL_TABLET | Freq: Two times a day (BID) | ORAL | 1 refills | Status: DC

## 2020-09-16 NOTE — Progress Notes (Signed)
ROB: Has questions about natural labor. Reports she does not desire IOL if not needed. Discussed [facility] policy regarding inductions. Needs to begin HSV suprression, ordered Valtrex.  36 week cultures performed. RTC in 1 week.

## 2020-09-16 NOTE — Progress Notes (Signed)
OB-Pt present for routine prenatal care. Pt stated fetal movement present; possible braxton hick contractions present; no vaginal bleeding and no changes in vaginal discharge.  Pt c/o lower abd pain and pressure.

## 2020-09-16 NOTE — Patient Instructions (Signed)
Breastfeeding and Breast Care It is normal to have some problems when you start to breastfeed your new baby. But there are things that you can do to take care of yourself and help prevent problems. This includes keeping your breasts healthy and making sure that your baby's mouth attaches (latches) properly to your nipple for feedings. Work with your doctor or breastfeeding specialist to find what works best foryou. How does self-care benefit me? If you keep your breasts healthy and you let your baby attach to your nipples in the right way, you will avoid these problems: Cracked or sore nipples. Breasts becoming overfilled with milk. Plugged milk ducts. Low milk supply. Breast swelling or infection. How does self-care benefit my baby? By preventing problems with your breasts, you will ensure that your baby willfeed well and will gain the right amount of weight. What actions can I take to care for myself during breastfeeding? Best ways to breastfeed Always make sure that your baby latches properly to breastfeed. Make sure that your baby is in a proper position. Try different breastfeeding positions to find one that works best for you and your baby. Breastfeed when you feel like you need to make your breasts less full or when your baby shows signs of hunger. This is called "breastfeeding on demand." Do not delay feedings. Try to relax when it is time to feed your baby. This helps your body release milk from your breast. To help increase milk flow, do these things before feeding: Remove a small amount of milk from your breast. Use a pump or squeeze with your hand. Apply warm, moist heat to your breast. Do this in the shower or use hand towels soaked with warm water. Massage your breasts. Do this when you are breastfeeding as well. Caring for your breasts     To help your breasts stay healthy and keep them from getting too dry: Avoid using soap on your nipples. Let your nipples air-dry for  3-4 minutes after each feeding. Do not use things like a hair dryer to dry your breasts. This can make the skin dry and will cause irritation and pain. Use only cotton bra pads to soak up breast milk that leaks. Change the pads if they become soaked with milk. If you use bra pads that can be thrown away, change them often. Put some lanolin on your nipples after breastfeeding. Pure lanolin does not need to be washed off your nipple before you feed your baby again. Pure lanolin is not harmful to your baby. Rub some breast milk into your nipples: Use your hand to squeeze out a few drops of breast milk. Gently massage the milk into your nipples. Let your nipples air-dry. Wear a supportive nursing bra. Avoid wearing: Tight clothing. Underwire bras or bras that put pressure on your breasts. Use ice to help relieve pain or swelling of your breasts: Put ice in a plastic bag. Place a towel between your skin and the bag. Leave the ice on for 20 minutes, 2-3 times a day. Follow these instructions at home: Drink enough fluid to keep your pee (urine) pale yellow. Get plenty of rest. Sleep when your baby sleeps. Talk to your doctor or breastfeeding specialist before taking any herbal supplements. Eat a balanced diet. This includes fruits, vegetables, whole grains, lean proteins, and dairy or dairy alternatives Contact a health care provider if: You have nipple pain. You have cracking or soreness in your nipples that lasts longer than 1 week. Your breasts are  overfilled with milk, and this lasts longer than 48 hours. You have a fever. You have pus-like fluid coming from your nipple. You have redness, a rash, swelling, itching, or burning on your breast. Your baby does not gain weight. Your baby loses weight. Your baby is not feeding regularly or is very sleepy and lacks energy. Summary There are things that you can do to take care of yourself and help prevent many common breastfeeding problems. Always  make sure that your baby's mouth attaches (latches) to your nipple properly to breastfeed. Keep your nipples from getting too dry, drink plenty of fluid, and get plenty of rest. Feed on demand. Do not delay feedings. This information is not intended to replace advice given to you by your health care provider. Make sure you discuss any questions you have with your health care provider. Document Revised: 07/08/2019 Document Reviewed: 07/08/2019 Elsevier Patient Education  2022 Elsevier Inc.    Common Medications Safe in Pregnancy  Acne:      Constipation:  Benzoyl Peroxide     Colace  Clindamycin      Dulcolax Suppository  Topica Erythromycin     Fibercon  Salicylic Acid      Metamucil         Miralax AVOID:        Senakot   Accutane    Cough:  Retin-A       Cough Drops  Tetracycline      Phenergan w/ Codeine if Rx  Minocycline      Robitussin (Plain & DM)  Antibiotics:     Crabs/Lice:  Ceclor       RID  Cephalosporins    AVOID:  E-Mycins      Kwell  Keflex  Macrobid/Macrodantin   Diarrhea:  Penicillin      Kao-Pectate  Zithromax      Imodium AD         PUSH FLUIDS AVOID:       Cipro     Fever:  Tetracycline      Tylenol (Regular or Extra  Minocycline       Strength)  Levaquin      Extra Strength-Do not          Exceed 8 tabs/24 hrs Caffeine:        <200mg/day (equiv. To 1 cup of coffee or  approx. 3 12 oz sodas)         Gas: Cold/Hayfever:       Gas-X  Benadryl      Mylicon  Claritin       Phazyme  **Claritin-D        Chlor-Trimeton    Headaches:  Dimetapp      ASA-Free Excedrin  Drixoral-Non-Drowsy     Cold Compress  Mucinex (Guaifenasin)     Tylenol (Regular or Extra  Sudafed/Sudafed-12 Hour     Strength)  **Sudafed PE Pseudoephedrine   Tylenol Cold & Sinus     Vicks Vapor Rub  Zyrtec  **AVOID if Problems With Blood Pressure         Heartburn: Avoid lying down for at least 1 hour after meals  Aciphex      Maalox     Rash:  Milk of  Magnesia     Benadryl    Mylanta       1% Hydrocortisone Cream  Pepcid  Pepcid Complete   Sleep Aids:  Prevacid      Ambien   Prilosec       Benadryl    Rolaids       Chamomile Tea  Tums (Limit 4/day)     Unisom         Tylenol PM         Warm milk-add vanilla or  Hemorrhoids:       Sugar for taste  Anusol/Anusol H.C.  (RX: Analapram 2.5%)  Sugar Substitutes:  Hydrocortisone OTC     Ok in moderation  Preparation H      Tucks        Vaseline lotion applied to tissue with wiping    Herpes:     Throat:  Acyclovir      Oragel  Famvir  Valtrex     Vaccines:         Flu Shot Leg Cramps:       *Gardasil  Benadryl      Hepatitis A         Hepatitis B Nasal Spray:       Pneumovax  Saline Nasal Spray     Polio Booster         Tetanus Nausea:       Tuberculosis test or PPD  Vitamin B6 25 mg TID   AVOID:    Dramamine      *Gardasil  Emetrol       Live Poliovirus  Ginger Root 250 mg QID    MMR (measles, mumps &  High Complex Carbs @ Bedtime    rebella)  Sea Bands-Accupressure    Varicella (Chickenpox)  Unisom 1/2 tab TID     *No known complications           If received before Pain:         Known pregnancy;   Darvocet       Resume series after  Lortab        Delivery  Percocet    Yeast:   Tramadol      Femstat  Tylenol 3      Gyne-lotrimin  Ultram       Monistat  Vicodin           MISC:         All Sunscreens           Hair Coloring/highlights          Insect Repellant's          (Including DEET)         Mystic Tans

## 2020-09-18 LAB — STREP GP B NAA: Strep Gp B NAA: NEGATIVE

## 2020-09-21 LAB — GC/CHLAMYDIA PROBE AMP
Chlamydia trachomatis, NAA: NEGATIVE
Neisseria Gonorrhoeae by PCR: NEGATIVE

## 2020-09-23 ENCOUNTER — Other Ambulatory Visit: Payer: Self-pay

## 2020-09-23 ENCOUNTER — Ambulatory Visit (INDEPENDENT_AMBULATORY_CARE_PROVIDER_SITE_OTHER): Payer: 59 | Admitting: Obstetrics and Gynecology

## 2020-09-23 VITALS — BP 107/71 | HR 106 | Wt 152.1 lb

## 2020-09-23 DIAGNOSIS — Z3403 Encounter for supervision of normal first pregnancy, third trimester: Secondary | ICD-10-CM

## 2020-09-23 DIAGNOSIS — Z3A36 36 weeks gestation of pregnancy: Secondary | ICD-10-CM

## 2020-09-23 DIAGNOSIS — Z113 Encounter for screening for infections with a predominantly sexual mode of transmission: Secondary | ICD-10-CM

## 2020-09-23 LAB — POCT URINALYSIS DIPSTICK OB
Bilirubin, UA: NEGATIVE
Blood, UA: NEGATIVE
Glucose, UA: NEGATIVE
Leukocytes, UA: NEGATIVE
Nitrite, UA: NEGATIVE
POC,PROTEIN,UA: NEGATIVE
Spec Grav, UA: 1.015 (ref 1.010–1.025)
Urobilinogen, UA: 0.2 E.U./dL
pH, UA: 7 (ref 5.0–8.0)

## 2020-09-23 NOTE — Progress Notes (Signed)
ROB: Possible Braxton Hicks but no strong contractions.  Daily fetal movement.  36-week culture results reviewed.  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

## 2020-10-01 ENCOUNTER — Telehealth: Payer: Self-pay | Admitting: Obstetrics and Gynecology

## 2020-10-01 NOTE — Telephone Encounter (Signed)
Patient notified FMLA paperwork is complete and has been faxed

## 2020-10-01 NOTE — Telephone Encounter (Signed)
Patient has called asking when he FMLA paperwork will be completed.  She stated that she turned them in on 8/22 and hasn't heard back

## 2020-10-05 ENCOUNTER — Encounter: Payer: 59 | Admitting: Obstetrics and Gynecology

## 2020-10-06 NOTE — Patient Instructions (Signed)
Common Medications Safe in Pregnancy  Acne:      Constipation:  Benzoyl Peroxide     Colace  Clindamycin      Dulcolax Suppository  Topica Erythromycin     Fibercon  Salicylic Acid      Metamucil         Miralax AVOID:        Senakot   Accutane    Cough:  Retin-A       Cough Drops  Tetracycline      Phenergan w/ Codeine if Rx  Minocycline      Robitussin (Plain & DM)  Antibiotics:     Crabs/Lice:  Ceclor       RID  Cephalosporins    AVOID:  E-Mycins      Kwell  Keflex  Macrobid/Macrodantin   Diarrhea:  Penicillin      Kao-Pectate  Zithromax      Imodium AD         PUSH FLUIDS AVOID:       Cipro     Fever:  Tetracycline      Tylenol (Regular or Extra  Minocycline       Strength)  Levaquin      Extra Strength-Do not          Exceed 8 tabs/24 hrs Caffeine:        <213m/day (equiv. To 1 cup of coffee or  approx. 3 12 oz sodas)         Gas: Cold/Hayfever:       Gas-X  Benadryl      Mylicon  Claritin       Phazyme  **Claritin-D        Chlor-Trimeton    Headaches:  Dimetapp      ASA-Free Excedrin  Drixoral-Non-Drowsy     Cold Compress  Mucinex (Guaifenasin)     Tylenol (Regular or Extra  Sudafed/Sudafed-12 Hour     Strength)  **Sudafed PE Pseudoephedrine   Tylenol Cold & Sinus     Vicks Vapor Rub  Zyrtec  **AVOID if Problems With Blood Pressure         Heartburn: Avoid lying down for at least 1 hour after meals  Aciphex      Maalox     Rash:  Milk of Magnesia     Benadryl    Mylanta       1% Hydrocortisone Cream  Pepcid  Pepcid Complete   Sleep Aids:  Prevacid      Ambien   Prilosec       Benadryl  Rolaids       Chamomile Tea  Tums (Limit 4/day)     Unisom         Tylenol PM         Warm milk-add vanilla or  Hemorrhoids:       Sugar for taste  Anusol/Anusol H.C.  (RX: Analapram 2.5%)  Sugar Substitutes:  Hydrocortisone OTC     Ok in moderation  Preparation H      Tucks        Vaseline lotion applied to tissue with  wiping    Herpes:     Throat:  Acyclovir      Oragel  Famvir  Valtrex     Vaccines:         Flu Shot Leg Cramps:       *Gardasil  Benadryl      Hepatitis A         Hepatitis B Nasal Spray:  Pneumovax  Saline Nasal Spray     Polio Booster         Tetanus Nausea:       Tuberculosis test or PPD  Vitamin B6 25 mg TID   AVOID:    Dramamine      *Gardasil  Emetrol       Live Poliovirus  Ginger Root 250 mg QID    MMR (measles, mumps &  High Complex Carbs @ Bedtime    rebella)  Sea Bands-Accupressure    Varicella (Chickenpox)  Unisom 1/2 tab TID     *No known complications           If received before Pain:         Known pregnancy;   Darvocet       Resume series after  Lortab        Delivery  Percocet    Yeast:   Tramadol      Femstat  Tylenol 3      Gyne-lotrimin  Ultram       Monistat  Vicodin           MISC:         All Sunscreens           Hair Coloring/highlights          Insect Repellant's          (Including DEET)         Mystic Tans

## 2020-10-07 ENCOUNTER — Ambulatory Visit (INDEPENDENT_AMBULATORY_CARE_PROVIDER_SITE_OTHER): Payer: 59 | Admitting: Obstetrics and Gynecology

## 2020-10-07 ENCOUNTER — Other Ambulatory Visit: Payer: Self-pay

## 2020-10-07 ENCOUNTER — Encounter: Payer: Self-pay | Admitting: Obstetrics and Gynecology

## 2020-10-07 VITALS — BP 110/74 | HR 93 | Wt 154.9 lb

## 2020-10-07 DIAGNOSIS — Z3403 Encounter for supervision of normal first pregnancy, third trimester: Secondary | ICD-10-CM

## 2020-10-07 DIAGNOSIS — O26893 Other specified pregnancy related conditions, third trimester: Secondary | ICD-10-CM

## 2020-10-07 DIAGNOSIS — Z3A38 38 weeks gestation of pregnancy: Secondary | ICD-10-CM

## 2020-10-07 DIAGNOSIS — R12 Heartburn: Secondary | ICD-10-CM

## 2020-10-07 LAB — POCT URINALYSIS DIPSTICK OB
Bilirubin, UA: NEGATIVE
Blood, UA: NEGATIVE
Glucose, UA: NEGATIVE
Ketones, UA: NEGATIVE
Leukocytes, UA: NEGATIVE
Nitrite, UA: NEGATIVE
POC,PROTEIN,UA: NEGATIVE
Spec Grav, UA: 1.01 (ref 1.010–1.025)
Urobilinogen, UA: 0.2 E.U./dL
pH, UA: 7.5 (ref 5.0–8.0)

## 2020-10-07 MED ORDER — PANTOPRAZOLE SODIUM 20 MG PO TBEC: 20.0000 mg | DELAYED_RELEASE_TABLET | Freq: Two times a day (BID) | ORAL | 0 refills | Status: DC

## 2020-10-07 NOTE — Progress Notes (Signed)
ROB: Reports issues with heartburn, keeps her up at night. Has tried Tums. Will prescribe Protonix. Discussed labor. Discussed IOL labor by 41 weeks if no delivery. RTC in 1 week.

## 2020-10-07 NOTE — Progress Notes (Signed)
   OB-Pt present for routine prenatal care. Pt stated fetal movement present; braxton hick contractions present; no vaginal bleeding and no changes in vaginal discharge.     Pt c/o swollen right ankle and pelvic area pain and pressure.

## 2020-10-11 NOTE — Patient Instructions (Signed)
Breastfeeding and Breast Care It is normal to have some problems when you start to breastfeed your new baby. But there are things that you can do to take care of yourself and help prevent problems. This includes keeping your breasts healthy and making sure that your baby's mouth attaches (latches) properly to your nipple for feedings. Work with your doctor or breastfeeding specialist to find what works best for you. How does self-care benefit me? If you keep your breasts healthy and you let your baby attach to your nipples in the right way, you will avoid these problems: Cracked or sore nipples. Breasts becoming overfilled with milk. Plugged milk ducts. Low milk supply. Breast swelling or infection. How does self-care benefit my baby? By preventing problems with your breasts, you will ensure that your baby will feed well and will gain the right amount of weight. What actions can I take to care for myself during breastfeeding? Best ways to breastfeed Always make sure that your baby latches properly to breastfeed. Make sure that your baby is in a proper position. Try different breastfeeding positions to find one that works best for you and your baby. Breastfeed when you feel like you need to make your breasts less full or when your baby shows signs of hunger. This is called "breastfeeding on demand." Do not delay feedings. Try to relax when it is time to feed your baby. This helps your body release milk from your breast. To help increase milk flow, do these things before feeding: Remove a small amount of milk from your breast. Use a pump or squeeze with your hand. Apply warm, moist heat to your breast. Do this in the shower or use hand towels soaked with warm water. Massage your breasts. Do this when you are breastfeeding as well. Caring for your breasts   To help your breasts stay healthy and keep them from getting too dry: Avoid using soap on your nipples. Let your nipples air-dry for 3-4  minutes after each feeding. Do not use things like a hair dryer to dry your breasts. This can make the skin dry and will cause irritation and pain. Use only cotton bra pads to soak up breast milk that leaks. Change the pads if they become soaked with milk. If you use bra pads that can be thrown away, change them often. Put some lanolin on your nipples after breastfeeding. Pure lanolin does not need to be washed off your nipple before you feed your baby again. Pure lanolin is not harmful to your baby. Rub some breast milk into your nipples: Use your hand to squeeze out a few drops of breast milk. Gently massage the milk into your nipples. Let your nipples air-dry. Wear a supportive nursing bra. Avoid wearing: Tight clothing. Underwire bras or bras that put pressure on your breasts. Use ice to help relieve pain or swelling of your breasts: Put ice in a plastic bag. Place a towel between your skin and the bag. Leave the ice on for 20 minutes, 2-3 times a day. Follow these instructions at home: Drink enough fluid to keep your pee (urine) pale yellow. Get plenty of rest. Sleep when your baby sleeps. Talk to your doctor or breastfeeding specialist before taking any herbal supplements. Eat a balanced diet. This includes fruits, vegetables, whole grains, lean proteins, and dairy or dairy alternatives Contact a health care provider if: You have nipple pain. You have cracking or soreness in your nipples that lasts longer than 1 week. Your breasts are  overfilled with milk, and this lasts longer than 48 hours. You have a fever. You have pus-like fluid coming from your nipple. You have redness, a rash, swelling, itching, or burning on your breast. Your baby does not gain weight. Your baby loses weight. Your baby is not feeding regularly or is very sleepy and lacks energy. Summary There are things that you can do to take care of yourself and help prevent many common breastfeeding problems. Always  make sure that your baby's mouth attaches (latches) to your nipple properly to breastfeed. Keep your nipples from getting too dry, drink plenty of fluid, and get plenty of rest. Feed on demand. Do not delay feedings. This information is not intended to replace advice given to you by your health care provider. Make sure you discuss any questions you have with your health care provider. Document Revised: 07/08/2019 Document Reviewed: 07/08/2019 Elsevier Patient Education  2022 [employer]    Common Medications Safe in Pregnancy  Acne:      Constipation:  Benzoyl Peroxide     Colace  Clindamycin      Dulcolax Suppository  Topica Erythromycin     Fibercon  Salicylic Acid      Metamucil         Miralax AVOID:        Senakot   Accutane    Cough:  Retin-A       Cough Drops  Tetracycline      Phenergan w/ Codeine if Rx  Minocycline      Robitussin (Plain & DM)  Antibiotics:     Crabs/Lice:  Ceclor       RID  Cephalosporins    AVOID:  E-Mycins      Kwell  Keflex  Macrobid/Macrodantin   Diarrhea:  Penicillin      Kao-Pectate  Zithromax      Imodium AD         PUSH FLUIDS AVOID:       Cipro     Fever:  Tetracycline      Tylenol (Regular or Extra  Minocycline       Strength)  Levaquin      Extra Strength-Do not          Exceed 8 tabs/24 hrs Caffeine:        <200mg/day (equiv. To 1 cup of coffee or  approx. 3 12 oz sodas)         Gas: Cold/Hayfever:       Gas-X  Benadryl      Mylicon  Claritin       Phazyme  **Claritin-D        Chlor-Trimeton    Headaches:  Dimetapp      ASA-Free Excedrin  Drixoral-Non-Drowsy     Cold Compress  Mucinex (Guaifenasin)     Tylenol (Regular or Extra  Sudafed/Sudafed-12 Hour     Strength)  **Sudafed PE Pseudoephedrine   Tylenol Cold & Sinus     Vicks Vapor Rub  Zyrtec  **AVOID if Problems With Blood Pressure         Heartburn: Avoid lying down for at least 1 hour after meals  Aciphex      Maalox     Rash:  Milk of  Magnesia     Benadryl    Mylanta       1% Hydrocortisone Cream  Pepcid  Pepcid Complete   Sleep Aids:  Prevacid      Ambien   Prilosec       Benadryl    Rolaids       Chamomile Tea  Tums (Limit 4/day)     Unisom         Tylenol PM         Warm milk-add vanilla or  Hemorrhoids:       Sugar for taste  Anusol/Anusol H.C.  (RX: Analapram 2.5%)  Sugar Substitutes:  Hydrocortisone OTC     Ok in moderation  Preparation H      Tucks        Vaseline lotion applied to tissue with wiping    Herpes:     Throat:  Acyclovir      Oragel  Famvir  Valtrex     Vaccines:         Flu Shot Leg Cramps:       *Gardasil  Benadryl      Hepatitis A         Hepatitis B Nasal Spray:       Pneumovax  Saline Nasal Spray     Polio Booster         Tetanus Nausea:       Tuberculosis test or PPD  Vitamin B6 25 mg TID   AVOID:    Dramamine      *Gardasil  Emetrol       Live Poliovirus  Ginger Root 250 mg QID    MMR (measles, mumps &  High Complex Carbs @ Bedtime    rebella)  Sea Bands-Accupressure    Varicella (Chickenpox)  Unisom 1/2 tab TID     *No known complications           If received before Pain:         Known pregnancy;   Darvocet       Resume series after  Lortab        Delivery  Percocet    Yeast:   Tramadol      Femstat  Tylenol 3      Gyne-lotrimin  Ultram       Monistat  Vicodin           MISC:         All Sunscreens           Hair Coloring/highlights          Insect Repellant's          (Including DEET)         Mystic Tans  

## 2020-10-12 ENCOUNTER — Ambulatory Visit (INDEPENDENT_AMBULATORY_CARE_PROVIDER_SITE_OTHER): Payer: 59 | Admitting: Obstetrics and Gynecology

## 2020-10-12 ENCOUNTER — Other Ambulatory Visit: Payer: Self-pay

## 2020-10-12 ENCOUNTER — Encounter: Payer: Self-pay | Admitting: Obstetrics and Gynecology

## 2020-10-12 VITALS — BP 102/68 | HR 80 | Wt 151.7 lb

## 2020-10-12 DIAGNOSIS — Z3403 Encounter for supervision of normal first pregnancy, third trimester: Secondary | ICD-10-CM

## 2020-10-12 DIAGNOSIS — Z3A39 39 weeks gestation of pregnancy: Secondary | ICD-10-CM

## 2020-10-12 LAB — POCT URINALYSIS DIPSTICK OB
Bilirubin, UA: NEGATIVE
Blood, UA: NEGATIVE
Glucose, UA: NEGATIVE
Ketones, UA: NEGATIVE
Leukocytes, UA: NEGATIVE
Nitrite, UA: NEGATIVE
POC,PROTEIN,UA: NEGATIVE
Spec Grav, UA: 1.01 (ref 1.010–1.025)
Urobilinogen, UA: 0.2 E.U./dL
pH, UA: 7 (ref 5.0–8.0)

## 2020-10-12 NOTE — Progress Notes (Signed)
   OB-Pt present for routine prenatal care. Pt stated fetal movement present; braxton hick contractions present; no vaginal bleeding and no changes in vaginal discharge.     Pt c/o pelvic pain and pressure.  Declined flu vaccine pt requested to get vaccine in mid October.

## 2020-10-12 NOTE — Progress Notes (Signed)
ROB: Not having painful contractions.  Generally doing well.  Induction scheduled for midnight 9/22.  Discussed induction.  Discussed use of misoprostol.  COVID testing after next office visit.

## 2020-10-16 ENCOUNTER — Other Ambulatory Visit (HOSPITAL_COMMUNITY): Payer: Self-pay

## 2020-10-16 MED ORDER — VALACYCLOVIR HCL 500 MG PO TABS
500.0000 mg | ORAL_TABLET | ORAL | 0 refills | Status: DC
  Filled 2020-10-16: qty 60, 30d supply, fill #0

## 2020-10-18 ENCOUNTER — Inpatient Hospital Stay: Payer: 59

## 2020-10-18 ENCOUNTER — Other Ambulatory Visit: Payer: Self-pay

## 2020-10-18 ENCOUNTER — Encounter
Admission: EM | Disposition: A | Discharge status: Home / Self Care | Payer: Self-pay | Source: Home / Self Care | Attending: Obstetrics and Gynecology

## 2020-10-18 ENCOUNTER — Inpatient Hospital Stay: Payer: 59 | Admitting: Anesthesiology

## 2020-10-18 ENCOUNTER — Encounter: Payer: Self-pay | Admitting: Obstetrics and Gynecology

## 2020-10-18 ENCOUNTER — Inpatient Hospital Stay
Admission: EM | Admit: 2020-10-18 | Discharge: 2020-10-20 | DRG: 787 | Disposition: A | Discharge status: Home / Self Care | Payer: 59 | Attending: Obstetrics and Gynecology | Admitting: Obstetrics and Gynecology

## 2020-10-18 DIAGNOSIS — A6000 Herpesviral infection of urogenital system, unspecified: Secondary | ICD-10-CM | POA: Diagnosis not present

## 2020-10-18 DIAGNOSIS — Z3A40 40 weeks gestation of pregnancy: Secondary | ICD-10-CM

## 2020-10-18 DIAGNOSIS — O4693 Antepartum hemorrhage, unspecified, third trimester: Secondary | ICD-10-CM | POA: Diagnosis not present

## 2020-10-18 DIAGNOSIS — O9832 Other infections with a predominantly sexual mode of transmission complicating childbirth: Secondary | ICD-10-CM | POA: Diagnosis present

## 2020-10-18 DIAGNOSIS — Z8616 Personal history of COVID-19: Secondary | ICD-10-CM

## 2020-10-18 DIAGNOSIS — O3413 Maternal care for benign tumor of corpus uteri, third trimester: Secondary | ICD-10-CM | POA: Diagnosis present

## 2020-10-18 DIAGNOSIS — O321XX0 Maternal care for breech presentation, not applicable or unspecified: Secondary | ICD-10-CM | POA: Diagnosis present

## 2020-10-18 DIAGNOSIS — Z20822 Contact with and (suspected) exposure to covid-19: Secondary | ICD-10-CM | POA: Diagnosis not present

## 2020-10-18 DIAGNOSIS — B009 Herpesviral infection, unspecified: Secondary | ICD-10-CM | POA: Diagnosis not present

## 2020-10-18 DIAGNOSIS — O480 Post-term pregnancy: Principal | ICD-10-CM | POA: Diagnosis present

## 2020-10-18 DIAGNOSIS — D252 Subserosal leiomyoma of uterus: Secondary | ICD-10-CM | POA: Diagnosis not present

## 2020-10-18 DIAGNOSIS — O479 False labor, unspecified: Secondary | ICD-10-CM | POA: Diagnosis present

## 2020-10-18 DIAGNOSIS — O9882 Other maternal infectious and parasitic diseases complicating childbirth: Secondary | ICD-10-CM

## 2020-10-18 LAB — TYPE AND SCREEN
ABO/RH(D): O POS
Antibody Screen: NEGATIVE

## 2020-10-18 LAB — CBC
HCT: 36.7 % (ref 36.0–46.0)
Hemoglobin: 12.4 g/dL (ref 12.0–15.0)
MCH: 29.6 pg (ref 26.0–34.0)
MCHC: 33.8 g/dL (ref 30.0–36.0)
MCV: 87.6 fL (ref 80.0–100.0)
Platelets: 176 10*3/uL (ref 150–400)
RBC: 4.19 MIL/uL (ref 3.87–5.11)
RDW: 16 % — ABNORMAL HIGH (ref 11.5–15.5)
WBC: 7.8 10*3/uL (ref 4.0–10.5)
nRBC: 0 % (ref 0.0–0.2)

## 2020-10-18 LAB — RESP PANEL BY RT-PCR (FLU A&B, COVID) ARPGX2
Influenza A by PCR: NEGATIVE
Influenza B by PCR: NEGATIVE
SARS Coronavirus 2 by RT PCR: NEGATIVE

## 2020-10-18 SURGERY — Surgical Case
Anesthesia: Spinal

## 2020-10-18 MED ORDER — LIDOCAINE HCL (PF) 1 % IJ SOLN: 30.0000 mL | INTRAMUSCULAR | Status: DC | PRN

## 2020-10-18 MED ORDER — IBUPROFEN 600 MG PO TABS: 600.0000 mg | ORAL_TABLET | Freq: Four times a day (QID) | ORAL | Status: DC

## 2020-10-18 MED ORDER — OXYCODONE HCL 5 MG/5ML PO SOLN
5.0000 mg | Freq: Once | ORAL | Status: DC | PRN
Start: 2020-10-18 — End: 2020-10-18

## 2020-10-18 MED ORDER — KETOROLAC TROMETHAMINE 30 MG/ML IJ SOLN: 30.0000 mg | Freq: Four times a day (QID) | INTRAMUSCULAR | Status: AC

## 2020-10-18 MED ORDER — MORPHINE SULFATE (PF) 0.5 MG/ML IJ SOLN
INTRAMUSCULAR | Status: DC | PRN
  Administered 2020-10-18: .1 mg via EPIDURAL

## 2020-10-18 MED ORDER — ONDANSETRON HCL 4 MG/2ML IJ SOLN: 4.0000 mg | Freq: Four times a day (QID) | INTRAMUSCULAR | Status: DC | PRN

## 2020-10-18 MED ORDER — DIPHENHYDRAMINE HCL 25 MG PO CAPS: 25.0000 mg | ORAL_CAPSULE | Freq: Four times a day (QID) | ORAL | Status: DC | PRN

## 2020-10-18 MED ORDER — OXYTOCIN BOLUS FROM INFUSION: 333.0000 mL | Freq: Once | INTRAVENOUS | Status: DC

## 2020-10-18 MED ORDER — NALBUPHINE HCL 10 MG/ML IJ SOLN: 5.0000 mg | Freq: Once | INTRAMUSCULAR | Status: DC | PRN

## 2020-10-18 MED ORDER — OXYTOCIN 10 UNIT/ML IJ SOLN
INTRAMUSCULAR | Status: AC
  Filled 2020-10-18: qty 2

## 2020-10-18 MED ORDER — SOD CITRATE-CITRIC ACID 500-334 MG/5ML PO SOLN
30.0000 mL | ORAL | Status: AC
  Administered 2020-10-18: 30 mL via ORAL

## 2020-10-18 MED ORDER — OXYCODONE HCL 5 MG PO TABS
5.0000 mg | ORAL_TABLET | ORAL | Status: AC | PRN
  Administered 2020-10-19: 5 mg via ORAL
  Filled 2020-10-18 (×2): qty 1

## 2020-10-18 MED ORDER — KETOROLAC TROMETHAMINE 30 MG/ML IJ SOLN
30.0000 mg | Freq: Four times a day (QID) | INTRAMUSCULAR | Status: AC
  Administered 2020-10-18 – 2020-10-19 (×3): 30 mg via INTRAVENOUS
  Filled 2020-10-18 (×3): qty 1

## 2020-10-18 MED ORDER — LACTATED RINGERS IV BOLUS
1000.0000 mL | Freq: Once | INTRAVENOUS | Status: DC
Start: 2020-10-18 — End: 2020-10-20

## 2020-10-18 MED ORDER — LACTATED RINGERS IV SOLN: 125.0000 mL/h | INTRAVENOUS | Status: DC

## 2020-10-18 MED ORDER — LACTATED RINGERS IV SOLN: 500.0000 mL | INTRAVENOUS | Status: DC | PRN

## 2020-10-18 MED ORDER — ACETAMINOPHEN 325 MG PO TABS: 650.0000 mg | ORAL_TABLET | ORAL | Status: DC | PRN

## 2020-10-18 MED ORDER — MENTHOL 3 MG MT LOZG
1.0000 | LOZENGE | OROMUCOSAL | Status: DC | PRN
  Filled 2020-10-18: qty 9

## 2020-10-18 MED ORDER — SIMETHICONE 80 MG PO CHEW
80.0000 mg | CHEWABLE_TABLET | Freq: Four times a day (QID) | ORAL | Status: DC
  Administered 2020-10-18 – 2020-10-20 (×6): 80 mg via ORAL
  Filled 2020-10-18 (×6): qty 1

## 2020-10-18 MED ORDER — DEXTROSE 5 % IV SOLN
1.0000 ug/kg/h | INTRAVENOUS | Status: DC | PRN
  Filled 2020-10-18: qty 5

## 2020-10-18 MED ORDER — IBUPROFEN 600 MG PO TABS
600.0000 mg | ORAL_TABLET | Freq: Four times a day (QID) | ORAL | Status: DC
  Administered 2020-10-19 – 2020-10-20 (×3): 600 mg via ORAL
  Filled 2020-10-18 (×3): qty 1

## 2020-10-18 MED ORDER — OXYCODONE HCL 5 MG PO TABS: 5.0000 mg | ORAL_TABLET | Freq: Once | ORAL | Status: DC | PRN

## 2020-10-18 MED ORDER — MORPHINE SULFATE (PF) 0.5 MG/ML IJ SOLN
INTRAMUSCULAR | Status: AC
  Filled 2020-10-18: qty 10

## 2020-10-18 MED ORDER — AMMONIA AROMATIC IN INHA
RESPIRATORY_TRACT | Status: AC
  Filled 2020-10-18: qty 10

## 2020-10-18 MED ORDER — ZOLPIDEM TARTRATE 5 MG PO TABS: 5.0000 mg | ORAL_TABLET | Freq: Every evening | ORAL | Status: DC | PRN

## 2020-10-18 MED ORDER — MISOPROSTOL 200 MCG PO TABS
ORAL_TABLET | ORAL | Status: AC
  Filled 2020-10-18: qty 4

## 2020-10-18 MED ORDER — DIPHENHYDRAMINE HCL 25 MG PO CAPS: 25.0000 mg | ORAL_CAPSULE | ORAL | Status: DC | PRN

## 2020-10-18 MED ORDER — OXYTOCIN-SODIUM CHLORIDE 30-0.9 UT/500ML-% IV SOLN
2.5000 [IU]/h | INTRAVENOUS | Status: AC
  Administered 2020-10-18: 2.5 [IU]/h via INTRAVENOUS
  Filled 2020-10-18: qty 500

## 2020-10-18 MED ORDER — DIPHENHYDRAMINE HCL 50 MG/ML IJ SOLN: 12.5000 mg | INTRAMUSCULAR | Status: DC | PRN

## 2020-10-18 MED ORDER — KETOROLAC TROMETHAMINE 30 MG/ML IJ SOLN
INTRAMUSCULAR | Status: DC | PRN
Start: 2020-10-18 — End: 2020-10-18
  Administered 2020-10-18: 30 mg via INTRAVENOUS

## 2020-10-18 MED ORDER — FENTANYL CITRATE (PF) 100 MCG/2ML IJ SOLN
INTRAMUSCULAR | Status: DC | PRN
  Administered 2020-10-18: 15 ug via INTRAVENOUS

## 2020-10-18 MED ORDER — LIDOCAINE 5 % EX PTCH
MEDICATED_PATCH | CUTANEOUS | Status: DC | PRN
  Administered 2020-10-18: 1 via TRANSDERMAL

## 2020-10-18 MED ORDER — LIDOCAINE HCL (PF) 1 % IJ SOLN
INTRAMUSCULAR | Status: AC
  Filled 2020-10-18: qty 30

## 2020-10-18 MED ORDER — NITROGLYCERIN IN D5W 200-5 MCG/ML-% IV SOLN
INTRAVENOUS | Status: AC
  Filled 2020-10-18: qty 250

## 2020-10-18 MED ORDER — NALBUPHINE HCL 10 MG/ML IJ SOLN: 5.0000 mg | INTRAMUSCULAR | Status: DC | PRN

## 2020-10-18 MED ORDER — PHENYLEPHRINE HCL (PRESSORS) 10 MG/ML IV SOLN
INTRAVENOUS | Status: DC | PRN
  Administered 2020-10-18 (×2): 100 ug via INTRAVENOUS

## 2020-10-18 MED ORDER — OXYCODONE-ACETAMINOPHEN 5-325 MG PO TABS: 2.0000 | ORAL_TABLET | ORAL | Status: DC | PRN

## 2020-10-18 MED ORDER — FENTANYL CITRATE (PF) 100 MCG/2ML IJ SOLN
INTRAMUSCULAR | Status: AC
  Filled 2020-10-18: qty 2

## 2020-10-18 MED ORDER — NALOXONE HCL 0.4 MG/ML IJ SOLN: 0.4000 mg | INTRAMUSCULAR | Status: DC | PRN

## 2020-10-18 MED ORDER — FENTANYL CITRATE (PF) 100 MCG/2ML IJ SOLN
INTRAMUSCULAR | Status: AC
Start: 2020-10-18 — End: 2020-10-19
  Filled 2020-10-18: qty 2

## 2020-10-18 MED ORDER — ONDANSETRON HCL 4 MG/2ML IJ SOLN: 4.0000 mg | Freq: Three times a day (TID) | INTRAMUSCULAR | Status: DC | PRN

## 2020-10-18 MED ORDER — BUPIVACAINE IN DEXTROSE 0.75-8.25 % IT SOLN
INTRATHECAL | Status: DC | PRN
  Administered 2020-10-18: 1.6 mL via INTRATHECAL

## 2020-10-18 MED ORDER — SODIUM CHLORIDE 0.9% FLUSH: 3.0000 mL | INTRAVENOUS | Status: DC | PRN

## 2020-10-18 MED ORDER — PRENATAL MULTIVITAMIN CH
1.0000 | ORAL_TABLET | Freq: Every day | ORAL | Status: DC
  Administered 2020-10-19: 1 via ORAL
  Filled 2020-10-18: qty 1

## 2020-10-18 MED ORDER — CEFAZOLIN SODIUM-DEXTROSE 2-4 GM/100ML-% IV SOLN
2.0000 g | INTRAVENOUS | Status: AC
  Administered 2020-10-18: 2 g via INTRAVENOUS
  Filled 2020-10-18: qty 100

## 2020-10-18 MED ORDER — LACTATED RINGERS IV SOLN: INTRAVENOUS | Status: DC

## 2020-10-18 MED ORDER — MEPERIDINE HCL 25 MG/ML IJ SOLN: 6.2500 mg | INTRAMUSCULAR | Status: DC | PRN

## 2020-10-18 MED ORDER — OXYTOCIN-SODIUM CHLORIDE 30-0.9 UT/500ML-% IV SOLN
2.5000 [IU]/h | INTRAVENOUS | Status: DC
  Administered 2020-10-18: 30 [IU] via INTRAVENOUS
  Filled 2020-10-18: qty 500

## 2020-10-18 MED ORDER — SENNOSIDES-DOCUSATE SODIUM 8.6-50 MG PO TABS
2.0000 | ORAL_TABLET | ORAL | Status: DC
  Administered 2020-10-18 – 2020-10-19 (×2): 2 via ORAL
  Filled 2020-10-18 (×2): qty 2

## 2020-10-18 MED ORDER — FENTANYL CITRATE (PF) 100 MCG/2ML IJ SOLN
25.0000 ug | INTRAMUSCULAR | Status: DC | PRN
  Administered 2020-10-18 (×2): 50 ug via INTRAVENOUS

## 2020-10-18 MED ORDER — SODIUM CHLORIDE 0.9 % IV SOLN
500.0000 mg | INTRAVENOUS | Status: AC
  Administered 2020-10-18: 500 mg via INTRAVENOUS
  Filled 2020-10-18 (×2): qty 500

## 2020-10-18 MED ORDER — ACETAMINOPHEN 325 MG PO TABS
650.0000 mg | ORAL_TABLET | Freq: Four times a day (QID) | ORAL | Status: AC
  Administered 2020-10-18 – 2020-10-19 (×4): 650 mg via ORAL
  Filled 2020-10-18 (×4): qty 2

## 2020-10-18 MED ORDER — ONDANSETRON HCL 4 MG/2ML IJ SOLN
INTRAMUSCULAR | Status: DC | PRN
  Administered 2020-10-18: 4 mg via INTRAVENOUS

## 2020-10-18 MED ORDER — SODIUM CHLORIDE 0.9 % IV SOLN
INTRAVENOUS | Status: DC | PRN
  Administered 2020-10-18: 50 ug/min via INTRAVENOUS

## 2020-10-18 MED ORDER — SOD CITRATE-CITRIC ACID 500-334 MG/5ML PO SOLN: 30.0000 mL | ORAL | Status: DC | PRN

## 2020-10-18 MED ORDER — SOD CITRATE-CITRIC ACID 500-334 MG/5ML PO SOLN
ORAL | Status: AC
  Filled 2020-10-18: qty 15

## 2020-10-18 MED ORDER — OXYCODONE-ACETAMINOPHEN 5-325 MG PO TABS
1.0000 | ORAL_TABLET | ORAL | Status: DC | PRN
  Administered 2020-10-19 – 2020-10-20 (×3): 1 via ORAL
  Filled 2020-10-18 (×3): qty 1

## 2020-10-18 MED ORDER — OXYCODONE-ACETAMINOPHEN 5-325 MG PO TABS
1.0000 | ORAL_TABLET | ORAL | Status: DC | PRN
Start: 2020-10-18 — End: 2020-10-18

## 2020-10-18 MED ORDER — LIDOCAINE 5 % EX PTCH
MEDICATED_PATCH | CUTANEOUS | Status: AC
  Filled 2020-10-18: qty 1

## 2020-10-18 SURGICAL SUPPLY — 25 items
ADHESIVE MASTISOL STRL (MISCELLANEOUS) ×2 IMPLANT
BAG COUNTER SPONGE SURGICOUNT (BAG) ×2 IMPLANT
CHLORAPREP W/TINT 26 (MISCELLANEOUS) ×4 IMPLANT
DRSG TELFA 3X8 NADH (GAUZE/BANDAGES/DRESSINGS) ×2 IMPLANT
GAUZE SPONGE 4X4 12PLY STRL (GAUZE/BANDAGES/DRESSINGS) ×2 IMPLANT
GLOVE SURG POLY ORTHO LF SZ7.5 (GLOVE) ×2 IMPLANT
GOWN STRL REUS W/ TWL LRG LVL3 (GOWN DISPOSABLE) ×2 IMPLANT
GOWN STRL REUS W/TWL LRG LVL3 (GOWN DISPOSABLE) ×2
KIT TURNOVER KIT A (KITS) ×2 IMPLANT
MANIFOLD NEPTUNE II (INSTRUMENTS) ×2 IMPLANT
MAT PREVALON FULL STRYKER (MISCELLANEOUS) ×2 IMPLANT
NS IRRIG 1000ML POUR BTL (IV SOLUTION) ×2 IMPLANT
PACK C SECTION AR (MISCELLANEOUS) ×2 IMPLANT
PAD OB MATERNITY 4.3X12.25 (PERSONAL CARE ITEMS) ×2 IMPLANT
PAD PREP 24X41 OB/GYN DISP (PERSONAL CARE ITEMS) ×2 IMPLANT
PENCIL SMOKE EVACUATOR (MISCELLANEOUS) ×2 IMPLANT
RETRACTOR WND ALEXIS-O 25 LRG (MISCELLANEOUS) ×1 IMPLANT
RTRCTR WOUND ALEXIS O 25CM LRG (MISCELLANEOUS) ×2
SCRUB EXIDINE 4% CHG 4OZ (MISCELLANEOUS) ×2 IMPLANT
SPONGE T-LAP 18X18 ~~LOC~~+RFID (SPONGE) ×2 IMPLANT
SUT VIC AB 0 CTX 36 (SUTURE) ×2
SUT VIC AB 0 CTX36XBRD ANBCTRL (SUTURE) ×2 IMPLANT
SUT VIC AB 1 CT1 36 (SUTURE) ×4 IMPLANT
SUT VICRYL+ 3-0 36IN CT-1 (SUTURE) ×4 IMPLANT
WATER STERILE IRR 500ML POUR (IV SOLUTION) ×2 IMPLANT

## 2020-10-18 NOTE — Lactation Note (Signed)
This note was copied from a baby's chart. Lactation Consultation Note  Patient Name: Girl Coye Kassam [id number] Date: 10/18/2020 Reason for consult: L&D Initial assessment;Primapara;Term;Other (Comment) (breech; c-section) Age:33 hours  Lactation called to [place] post c-section for assistance with attempted first feeding. Baby born via c-section to first time mom after laboring and exam proved baby to be breech position.   Baby has been sleeping for 2hrs post delivery, but starting to wake. [place] assisted with positioning and latching attempt. Baby did grasp breast with a few sucks but then fell asleep quickly. Hand expression taught to mom, to help encourage feeding. Baby remained asleep skin to skin with mom.  Encouraged mom to continue with frequent attempts, assistance available for helping to feed. No concerns at this time.  Maternal Data Has patient been taught Hand Expression?: Yes Does the patient have breastfeeding experience prior to this delivery?: No  Feeding Mother's Current Feeding Choice: Breast Milk  LATCH Score Latch: Repeated attempts needed to sustain latch, nipple held in mouth throughout feeding, stimulation needed to elicit sucking reflex.  Audible Swallowing: None  Type of Nipple: Everted at rest and after stimulation  Comfort (Breast/Nipple): Soft / non-tender  Hold (Positioning): Assistance needed to correctly position infant at breast and maintain latch.  LATCH Score: 6   Lactation Tools Discussed/Used    Interventions Interventions: Breast feeding basics reviewed;Assisted with latch;Hand express;Position options;Adjust position;Education  Discharge    Consult Status Consult Status: Follow-up Date: 10/18/20 Follow-up type: In-patient    Lavonia Drafts 10/18/2020, 3:11 PM

## 2020-10-18 NOTE — Transfer of Care (Signed)
Immediate Anesthesia Transfer of Care Note  Patient: Jasmin Jackson  Procedure(s) Performed: CESAREAN SECTION  Patient Location: LDR 4  Anesthesia Type:Spinal  Level of Consciousness: awake, alert  and oriented  Airway & Oxygen Therapy: Patient Spontanous Breathing  Post-op Assessment: Report given to RN and Post -op Vital signs reviewed and stable  Post vital signs: Reviewed and stable  Last Vitals:  Vitals Value Taken Time  BP 92/52   Temp    Pulse 76   Resp 12   SpO2 100     Last Pain:  Vitals:   10/18/20 0811  TempSrc:   PainSc: 7          Complications: No notable events documented.

## 2020-10-18 NOTE — Anesthesia Preprocedure Evaluation (Signed)
Anesthesia Evaluation  Patient identified by MRN, date of birth, ID band Patient awake    Reviewed: Allergy & Precautions, NPO status , Patient's Chart, lab work & pertinent test results  History of Anesthesia Complications Negative for: history of anesthetic complications  Airway Mallampati: III  TM Distance: >3 FB Neck ROM: full    Dental  (+) Chipped   Pulmonary neg pulmonary ROS, neg shortness of breath,    Pulmonary exam normal        Cardiovascular Exercise Tolerance: Good (-) hypertensionnegative cardio ROS Normal cardiovascular exam     Neuro/Psych  Headaches,    GI/Hepatic negative GI ROS, neg GERD  ,  Endo/Other    Renal/GU   negative genitourinary   Musculoskeletal   Abdominal   Peds  Hematology negative hematology ROS (+)   Anesthesia Other Findings Past Medical History: No date: Alopecia No date: Anxiety No date: Chlamydia 06/05/2020: COVID-19     Comment:  home test positive yesterday 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 No pertinent surgical history.  BMI    Body Mass Index: 23.81 kg/m      Reproductive/Obstetrics (+) Pregnancy                             Anesthesia Physical Anesthesia Plan  ASA: 2 and emergent  Anesthesia Plan: Spinal   Post-op Pain Management:    Induction:   PONV Risk Score and Plan:   Airway Management Planned: Natural Airway and Nasal Cannula  Additional Equipment:   Intra-op Plan:   Post-operative Plan:   Informed Consent: I have reviewed the patients History and Physical, chart, labs and discussed the procedure including the risks, benefits and alternatives for the proposed anesthesia with the patient or authorized representative who has indicated his/her understanding and acceptance.     Dental Advisory Given  Plan Discussed with:  Anesthesiologist, CRNA and Surgeon  Anesthesia Plan Comments: (Patient reports no bleeding problems and no anticoagulant use.  Plan for spinal with backup GA  Patient consented for risks of anesthesia including but not limited to:  - adverse reactions to medications - damage to eyes, teeth, lips or other oral mucosa - nerve damage due to positioning  - risk of bleeding, infection and or nerve damage from spinal that could lead to paralysis - risk of headache or failed spinal - damage to teeth, lips or other oral mucosa - sore throat or hoarseness - damage to heart, brain, nerves, lungs, other parts of body or loss of life  Patient voiced understanding.)        Anesthesia Quick Evaluation

## 2020-10-18 NOTE — Anesthesia Procedure Notes (Signed)
Spinal  Patient location during procedure: OR Start time: 10/18/2020 12:10 PM End time: 10/18/2020 12:12 PM Reason for block: surgical anesthesia Staffing Performed: resident/CRNA  Anesthesiologist: Piscitello, Precious Haws, MD Resident/CRNA: Aline Brochure, CRNA Preanesthetic Checklist Completed: patient identified, IV checked, site marked, risks and benefits discussed, surgical consent, monitors and equipment checked, pre-op evaluation and timeout performed Spinal Block Patient position: sitting Prep: ChloraPrep Patient monitoring: heart rate, continuous pulse ox, blood pressure and cardiac monitor Approach: midline Location: L3-4 Injection technique: single-shot Needle Needle type: Whitacre, Introducer and Pencan  Needle gauge: 24 G Needle length: 9 cm Assessment Sensory level: T10 Events: CSF return Additional Notes Negative paresthesia. Negative blood return. Positive free-flowing CSF. Expiration date of kit checked and confirmed. Patient tolerated procedure well, without complications.

## 2020-10-18 NOTE — Discharge Instructions (Signed)
Discharge Instructions:  ° °Follow-up Appointment:  ° °If there are any new medications, they have been ordered and will be available for pickup at the listed pharmacy on your way home from the hospital.  ° °Call office if you have any of the following: headache, visual changes, fever >101.0 F, chills, shortness of breath, breast concerns, excessive vaginal bleeding, incision drainage or problems, leg pain or redness, depression or any other concerns. If you have vaginal discharge with an odor, let your doctor know.  ° °It is normal to bleed for up to 6 weeks. You should not soak through more than 1 pad in 1 hour. If you have a blood clot larger than your fist with continued bleeding, call your doctor.  ° °After a c-section, you should expect a small amount of blood or clear fluid coming from the incision and abdominal cramping/soreness. Inspect your incision site daily. Stand in front of a mirror to look for any redness, incision opening, or discolored/odorness drainage. Take a shower daily and continue good hygiene. Use own towel and washcloth (do not share). Make sure your sheets on your bed are clean. No pets sleeping around your incision site. Dressing will be removed at your postpartum visit. If the dressing does become wet or soiled underneath, it is okay to remove it.  ° °Activity: Do not lift > 10 lbs for 6 weeks (do not lift anything heavier than your baby). °No intercourse, tampons, swimming pools, hot tubs, baths (only showers) for 6 weeks.  °No driving for 1-2 weeks. °Continue prenatal vitamin, especially if breastfeeding. °Increase calories and fluids (water) while breastfeeding.  ° °Your milk will come in, in the next couple of days (right now it is colostrum). You may have a slight fever when your milk comes in, but it should go away on its own.  If it does not, and rises above 101 F please call the doctor. You will also feel achy and your breasts will be firm. They will also start to leak. If you  are breastfeeding, continue as you have been and you can pump/express milk for comfort.  ° °If you have too much milk, your breasts can become engorged, which could lead to mastitis. This is an infection of the milk ducts. It can be very painful and you will need to notify your doctor to obtain a prescription for antibiotics. You can also treat it with a shower or hot/cold compress.  ° °For concerns about your baby, please call your pediatrician.  °For breastfeeding concerns, the lactation consultant can be reached at [phone].  ° °Postpartum blues (feelings of happy one minute and sad another minute) are normal for the first few weeks but if it gets worse let your doctor know.  ° °Congratulations! We enjoyed caring for you and your new bundle of joy!  °

## 2020-10-18 NOTE — Progress Notes (Signed)
Foley catheter removed at 2130. Pt able to ambulate near bed. Encouraged pt to void within 4-6 hours post-catheter removal.

## 2020-10-18 NOTE — OB Triage Note (Signed)
Pt is a G1P0 at 40.3weeks presenting with bright red blood when wiping in bathroom. Reports + fetal movement and no rupture of membranes. Complaining of pressure in rectum no UCs.

## 2020-10-18 NOTE — H&P (Signed)
Obstetric History and Physical  Jasmin Jackson is a 35 y.o. G1P0 with IUP at 64w3dpresenting for contractions beginning at approximately 2:30 A.M. Patient states she has been having  irregular, every 2-6 minutes contractions, minimal vaginal bleeding, intact membranes, with active fetal movement.    Prenatal Course Source of Care: [facility] with onset of care at 28 weeks (transferred from [facility]  Pregnancy complications or risks: Patient Active Problem List   Diagnosis Date Noted   Uterine contractions during pregnancy 10/18/2020   Normal labor 10/18/2020   HSV infection    Uterine fibroid in pregnancy 07/29/2020   She plans to breastfeed She desires  undecided  for postpartum contraception.   Prenatal labs and studies: ABO, Rh: O/Positive/-- (02/24 1324) Antibody: Negative (02/24 1324) Rubella: Immune (02/24 1324) RPR: Non Reactive (06/30 1117)  HBsAg:    HIV: Non-reactive (02/24 1324)  [id number] (08/18 1411) 1 hr Glucola  normal Genetic screening normal Anatomy [place]   Past Medical History:  Diagnosis Date   Alopecia    Anxiety    Chlamydia    COVID-19 06/05/2020   home test positive yesterday   Fibroadenoma of right breast    Headache    Migraines   HSV infection     History reviewed. No pertinent surgical history.  OB History  Gravida Para Term Preterm AB Living  1            SAB IAB Ectopic Multiple Live Births               # Outcome Date GA Lbr Len/2nd Weight Sex Delivery Anes PTL Lv  1 Current             Social History   Socioeconomic History   Marital status: Married    Spouse name: Not on file   Number of children: Not on file   Years of education: Not on file   Highest education level: Not on file  Occupational History   Not on file  Tobacco Use   Smoking status: Never   Smokeless tobacco: Never  Vaping Use   Vaping Use: Never used  Substance and Sexual Activity   Alcohol use: Not Currently     Comment: occasionally    Drug use: Never   Sexual activity: Yes  Other Topics Concern   Not on file  Social History Narrative   Not on file   Social Determinants of Health   Financial Resource Strain: Not on file  Food Insecurity: Not on file  Transportation Needs: Not on file  Physical Activity: Not on file  Stress: Not on file  Social Connections: Not on file    Family History  Problem Relation Age of Onset   Liver disease Mother    Heart attack Father     Medications Prior to Admission  Medication Sig Dispense Refill Last Dose   pantoprazole (PROTONIX) 20 MG tablet Take 1 tablet (20 mg total) by mouth 2 (two) times daily before a meal. 60 tablet 0    Prenatal Vit-Fe Fumarate-FA (PRENATAL MULTIVITAMIN) TABS tablet Take 1 tablet by mouth daily at 12 noon.      valACYclovir (VALTREX) 500 MG tablet Take 1 tablet (500 mg total) by mouth 2 (two) times daily. 60 tablet 1    valACYclovir (VALTREX) 500 MG tablet Take 1 tablet (500 mg total) by mouth twice a day 60 tablet 0     No Known Allergies  Review of Systems: Negative except for what  is mentioned in HPI.  Physical Exam: BP 112/70   Pulse 69   Temp 98.4 F (36.9 C)   Resp 18   Ht '5\' 7"'$  (1.702 m)   Wt 68.9 kg   LMP 01/09/2020   BMI 23.81 kg/m  CONSTITUTIONAL: Well-developed, well-nourished female in no acute distress.  HENT:  Normocephalic, atraumatic, External right and left ear normal. Oropharynx is clear and moist EYES: Conjunctivae and EOM are normal. Pupils are equal, round, and reactive to light. No scleral icterus.  NECK: Normal range of motion, supple, no masses SKIN: Skin is warm and dry. No rash noted. Not diaphoretic. No erythema. No pallor. NEUROLOGIC: Alert and oriented to person, place, and time. Normal reflexes, muscle tone coordination. No cranial nerve deficit noted. PSYCHIATRIC: Normal mood and affect. Normal behavior. Normal judgment and thought content. CARDIOVASCULAR: Normal heart rate noted,  regular rhythm RESPIRATORY: Effort and breath sounds normal, no problems with respiration noted ABDOMEN: Soft, nontender, nondistended, gravid. MUSCULOSKELETAL: Normal range of motion. No edema and no tenderness. 2+ distal pulses.  Cervical Exam: Dilatation 4.5 cm   Effacement 90%   Station -2  (changed from 3/60/-2 at triage presentation) Presentation: cephalic FHT:  Baseline rate 150 bpm   Variability moderate  Accelerations present   Decelerations none Contractions: Every 2-6 mins   Pertinent Labs/Studies:   Results for orders placed or performed during the hospital encounter of 10/18/20 (from the past 24 hour(s))  Resp Panel by RT-PCR (Flu A&B, Covid) Nasopharyngeal Swab     Status: None   Collection Time: 10/18/20  6:55 AM   Specimen: Nasopharyngeal Swab; Nasopharyngeal(NP) swabs in vial transport medium  Result Value Ref Range   SARS Coronavirus 2 by RT PCR NEGATIVE NEGATIVE   Influenza A by PCR NEGATIVE NEGATIVE   Influenza B by PCR NEGATIVE NEGATIVE  CBC     Status: Abnormal   Collection Time: 10/18/20  9:12 AM  Result Value Ref Range   WBC 7.8 4.0 - 10.5 K/uL   RBC 4.19 3.87 - 5.11 MIL/uL   Hemoglobin 12.4 12.0 - 15.0 g/dL   HCT 36.7 36.0 - 46.0 %   MCV 87.6 80.0 - 100.0 fL   MCH 29.6 26.0 - 34.0 pg   MCHC 33.8 30.0 - 36.0 g/dL   RDW 16.0 (H) 11.5 - 15.5 %   Platelets 176 150 - 400 K/uL   nRBC 0.0 0.0 - 0.2 %  Type and screen [place]     Status: None (Preliminary result)   Collection Time: 10/18/20  9:12 AM  Result Value Ref Range   ABO/RH(D) PENDING    Antibody Screen PENDING    Sample Expiration      10/21/2020,2359 Performed at [facility], [address], [place], [place] [address]     Assessment : Jasmin Jackson is a 35 y.o. G1P0 at 26w3dbeing admitted for labor.  H/o HSV, currently on suppression. Postdates pregnancy  Plan: Labor: Expectant management. Augmentation if needed, as per protocol. Analgesia as needed. FWB:  Reassuring fetal heart tracing.  GBS negative Delivery plan: Hopeful for vaginal delivery    CRubie Maid MD [facility]

## 2020-10-18 NOTE — Op Note (Signed)
      OP NOTE  Date: 10/18/2020   2:03 PM Name Jasmin Jackson MR# [id number]  Preoperative Diagnosis: 1. Intrauterine pregnancy at 25w3dActive Problems:   Uterine contractions during pregnancy   HSV infection   Normal labor   Post-dates pregnancy   Meconium   Advanced active labor 2.  malpresentation: FPilar Platebreech  Postoperative Diagnosis: 1. Intrauterine pregnancy at 445w3ddelivered 2. Viable infant 3. Remainder same as pre-op   Procedure: 1.  Primary Low-Transverse Cesarean Section  Surgeon: DaFinis BudMD  Assistant: None  Anesthesia: Spinal    EBL: 458  ml     Findings: 1) female infant, Apgar scores of 7   at 1 minute and 9   at 5 minutes and a birthweight of 111.11  ounces.    2) Normal uterus, tubes and ovaries.    Procedure:  The patient was prepped and draped in the supine position and placed under spinal anesthesia.  A transverse incision was made across the abdomen in a Pfannenstiel manner. If indicated the old scar was systematically removed with sharp dissection.  We carried the dissection down to the level of the fascia.  The fascia was incised in a curvilinear manner.  The fascia was then elevated from the rectus muscles with blunt and sharp dissection.  The rectus muscles were separated laterally exposing the peritoneum.  The peritoneum was carefully entered with care being taken to avoid bowel and bladder.  A self-retaining retractor was placed.  The visceral peritoneum was incised in a curvilinear fashion across the lower uterine segment creating a bladder flap. A transverse incision was made across the lower uterine segment and extended laterally and superiorly using the bandage scissors.  Artificial rupture membranes was performed and Meconium fluid was noted.  The infant was delivered from the breech position.  A nuchal cord was not present. After an appropriate time interval, the cord was doubly clamped and cut. Cord blood was obtained if  required.  The infant was handed to the pediatric personnel  who then placed the infant under heat lamps where it was cleaned dried and suctioned as needed. The placenta was delivered. The hysterotomy incision was then identified on ring forceps.  The uterine cavity was cleaned with a moist lap sponge.  The hysterotomy incision was closed with a running interlocking suture of Vicryl followed by it imbricating suture.  Hemostasis was excellent.  Pitocin was run in the IV and the uterus was found to be firm.  A number of subserosal uterine fibroids were noted. The posterior cul-de-sac and gutters were cleaned and inspected.  Hemostasis was noted.  The fascia was then closed with a running suture of #1 Vicryl.  Hemostasis of the subcutaneous tissues was obtained using the Bovie.  The subcutaneous tissues were closed with a running suture of 000 Vicryl.  A subcuticular suture was placed.  Derma bond was applied in the usual manner.  A pressure dressing was placed.  The patient went to the recovery room in stable condition.   DaFinis BudM.D. 10/18/2020 2:03 PM

## 2020-10-19 ENCOUNTER — Encounter: Payer: Self-pay | Admitting: Obstetrics and Gynecology

## 2020-10-19 LAB — RPR: RPR Ser Ql: NONREACTIVE

## 2020-10-19 LAB — HEPATITIS B SURFACE ANTIBODY, QUANTITATIVE: Hep B S AB Quant (Post): 609.3 m[IU]/mL (ref >9.9)

## 2020-10-19 NOTE — Anesthesia Postprocedure Evaluation (Signed)
Anesthesia Post Note  Patient: Jasmin Jackson  Procedure(s) Performed: [place]  Patient location during evaluation: Mother Baby Anesthesia Type: Spinal Level of consciousness: oriented and awake and alert Pain management: pain level controlled Vital Signs Assessment: post-procedure vital signs reviewed and stable Respiratory status: spontaneous breathing and respiratory function stable Cardiovascular status: blood pressure returned to baseline and stable Postop Assessment: no headache, no backache, no apparent nausea or vomiting and able to ambulate Anesthetic complications: no   No notable events documented.   Last Vitals:  Vitals:   10/19/20 0700 10/19/20 0800  BP:  106/76  Pulse:  99  Resp:  18  Temp:  36.8 C  SpO2: 97% 100%    Last Pain:  Vitals:   10/19/20 0922  TempSrc:   PainSc: 6                  Caryl Asp

## 2020-10-19 NOTE — Progress Notes (Signed)
Notified by NT of patients BP 86/47. Patient stated her head has been feeling "foggy" and light headed all day. She stated she feels this way when she does not get enough sleep. Re-assessed BP- 90/64. Encouraged patient to try and get some rest and to notify RN of any acute changes or symptoms.

## 2020-10-19 NOTE — Lactation Note (Signed)
This note was copied from a baby's chart. Lactation Consultation Note  Patient Name: Jasmin Jackson [id number] Date: 10/19/2020 Reason for consult: Follow-up assessment;Mother's request;Primapara;Term Age:33 hours  Lactation at bedside for follow-up and per mom's request. Baby has been feeding and voiding overnight, mom is tender with initial latch but states it goes away once baby is in a rhythm. Nipples are well everted, and appear non damaged.   Mom prepares to latch baby in cradle hold, allowing baby to find breast on her own. LC adjusts baby's position to be nose to nipple, and encourages mom to assist baby with coming up onto the breast tissue while sandwiching her breast to ensure a deep latch. LC explained the difference in deep versus shallow latch, transient nipple tenderness, and use coconut oil for soothing.  Encouraged continued feedings on cue, provided tips for keeping baby awake while at the breast, and tracking of wet and poop diapers.  Maternal Data Has patient been taught Hand Expression?: Yes Does the patient have breastfeeding experience prior to this delivery?: No  Feeding Mother's Current Feeding Choice: Breast Milk  LATCH Score Latch: Grasps breast easily, tongue down, lips flanged, rhythmical sucking.  Audible Swallowing: A few with stimulation  Type of Nipple: Everted at rest and after stimulation  Comfort (Breast/Nipple): Soft / non-tender  Hold (Positioning): Assistance needed to correctly position infant at breast and maintain latch.  LATCH Score: 8   Lactation Tools Discussed/Used    Interventions Interventions: Breast feeding basics reviewed;Assisted with latch;Hand express;Support pillows;Education;Adjust position  Discharge    Consult Status Consult Status: Follow-up Date: 10/19/20 Follow-up type: In-patient    Lavonia Drafts 10/19/2020, 11:29 AM

## 2020-10-19 NOTE — Anesthesia Post-op Follow-up Note (Signed)
  Anesthesia Pain Follow-up Note  Patient: Jasmin Jackson  Day #: 1  Date of Follow-up: 10/19/2020 Time: 9:41 AM  Last Vitals:  Vitals:   10/19/20 0700 10/19/20 0800  BP:  106/76  Pulse:  99  Resp:  18  Temp:  36.8 C  SpO2: 97% 100%    Level of Consciousness: alert  Pain: none   Side Effects:None  Catheter Site Exam:clean, dry, no drainage     Plan: D/C from anesthesia care at surgeon's request  Caryl Asp

## 2020-10-19 NOTE — Progress Notes (Signed)
Patient ID: Jasmin Jackson, female   DOB: [birth year], 35 y.o.   MRN: [id number]   Progress Note - Cesarean Delivery  Beverlie Kurihara is a 35 y.o. G1P1001 now PP day 1 s/p C-Section, Low Transverse .   Subjective:  Patient reports no problems with eating, bowel movements, voiding, or their wound  She reports her pain is controlled.   She is breast-feeding.   Objective:  Vital signs in last 24 hours: Temp:  [97.7 F (36.5 C)-98.5 F (36.9 C)] 98.3 F (36.8 C) (09/20 0800) Pulse Rate:  [73-162] 99 (09/20 0800) Resp:  [16-20] 18 (09/20 0800) BP: (68-112)/(45-76) 106/76 (09/20 0800) SpO2:  [94 %-100 %] 100 % (09/20 0800)  Physical Exam:  General: alert, cooperative, and no distress Lochia: appropriate Uterine Fundus: firm Incision: Dressing intact    Data Review Recent Labs    10/18/20 0912  HGB 12.4  HCT 36.7    Assessment:  Active Problems:   Uterine contractions during pregnancy   HSV infection   Normal labor   Post-dates pregnancy   Status post Cesarean section. Doing well postoperatively.     Plan:       Continue current care.  Had a bad today -patient may shower -dressing removal  Probable discharge tomorrow.  Finis Bud, M.D. 10/19/2020 9:08 AM

## 2020-10-20 ENCOUNTER — Other Ambulatory Visit: Payer: 59

## 2020-10-20 ENCOUNTER — Encounter: Payer: 59 | Admitting: Obstetrics and Gynecology

## 2020-10-20 ENCOUNTER — Other Ambulatory Visit (HOSPITAL_COMMUNITY): Payer: Self-pay

## 2020-10-20 LAB — SURGICAL PATHOLOGY

## 2020-10-20 MED ORDER — IBUPROFEN 600 MG PO TABS: 600.0000 mg | ORAL_TABLET | Freq: Four times a day (QID) | ORAL | 0 refills | Status: DC

## 2020-10-20 MED ORDER — OXYCODONE-ACETAMINOPHEN 5-325 MG PO TABS: 1.0000 | ORAL_TABLET | ORAL | 0 refills | Status: DC | PRN

## 2020-10-20 NOTE — Discharge Summary (Signed)
Physician Obstetric Discharge Summary  Patient Name: Jasmin Jackson DOB: Jul 22, 1985 MRN: [id number]                            Discharge Summary  Date of Admission: 10/18/2020 Date of Discharge: 10/20/2020 Delivering Provider: Harlin Heys   Admitting Diagnosis: Normal labor [O80, Z37.9] at [redacted]w[redacted]d Secondary diagnosis:  Active Problems:   Uterine contractions during pregnancy   HSV infection   Normal labor   Post-dates pregnancy   Breech presentation  Mode of Delivery:       low uterine, transverse     Discharge diagnosis: Term Pregnancy Delivered                          Discharge Day SOAP Note:  Subjective:  The patient has no complaints.  She is ambulating well. She is taking PO well. Pain is well controlled with current medications. Patient is urinating without difficulty.   She is passing flatus.    Objective  Vital signs: BP (!) 88/55   Pulse 74   Temp 98.1 F (36.7 C) (Oral)   Resp 18   Ht 5\' 7"  (1.702 m)   Wt 68.9 kg   LMP 01/09/2020   SpO2 100%   Breastfeeding Unknown   BMI 23.81 kg/m   Physical Exam: Gen: NAD Abdomen:  clean, dry, healing Fundus Fundal Tone: Firm  Lochia Amount: Scant     Data Review Labs: Lab Results  Component Value Date   WBC 7.8 10/18/2020   HGB 12.4 10/18/2020   HCT 36.7 10/18/2020   MCV 87.6 10/18/2020   PLT 176 10/18/2020   CBC Latest Ref Rng & Units 10/18/2020 07/29/2020 03/25/2020  WBC 4.0 - 10.5 K/uL 7.8 3.7 -  Hemoglobin 12.0 - 15.0 g/dL 12.4 10.9(L) 11.8  Hematocrit 36.0 - 46.0 % 36.7 32.1(L) 34  Platelets 150 - 400 K/uL 176 169 271   O POS  Edinburgh Score: Edinburgh Postnatal Depression Scale Screening Tool 10/19/2020  I have been able to laugh and see the funny side of things. 0  I have looked forward with enjoyment to things. 0  I have blamed myself unnecessarily when things went wrong. 0  I have been anxious or worried for no good reason. 1  I have felt scared or panicky for no good reason. 0   Things have been getting on top of me. 0  I have been so unhappy that I have had difficulty sleeping. 0  I have felt sad or miserable. 0  I have been so unhappy that I have been crying. 0  The thought of harming myself has occurred to me. 0  Edinburgh Postnatal Depression Scale Total 1    Assessment:  Active Problems:   Uterine contractions during pregnancy   HSV infection   Normal labor   Post-dates pregnancy   Doing well.  Normal progress as expected.  Plan:  Discharge to home  Modified rest as directed - may slowly resume normal activities with restrictions  as discussed.  Medications as written.  See below for additional.      Discharge Instructions: Per After Visit Summary. Activity: Advance as tolerated. Pelvic rest for 6 weeks.  Also refer to After Visit Summary.  Wound care discussed. Diet: Regular Medications: Allergies as of 10/20/2020   No Known Allergies      Medication List     STOP taking these medications  valACYclovir 500 MG tablet Commonly known as: VALTREX       TAKE these medications    ibuprofen 600 MG tablet Commonly known as: ADVIL Take 1 tablet (600 mg total) by mouth every 6 (six) hours.   oxyCODONE-acetaminophen 5-325 MG tablet Commonly known as: PERCOCET/ROXICET Take 1 tablet by mouth every 4 (four) hours as needed for moderate pain.   pantoprazole 20 MG tablet Commonly known as: Protonix Take 1 tablet (20 mg total) by mouth 2 (two) times daily before a meal.   prenatal multivitamin Tabs tablet Take 1 tablet by mouth daily at 12 noon.               Discharge Care Instructions  (From admission, onward)           Start     Ordered   10/20/20 0000  No dressing needed       Comments: Keep wound area clean and dry   10/20/20 0748           Outpatient follow up:   Follow-up Information     Harlin Heys, MD Follow up in 1 week(s).   Specialties: Obstetrics and Gynecology, Radiology Contact  information: [place] [address] [phone]                Postpartum contraception: Will discuss at first post-partum visit.  Discharged Condition: good  Discharged to: home  Newborn Data: Disposition:home with mother  Apgars: APGAR (1 MIN): 7   APGAR (5 MINS): 9   APGAR (10 MINS):    Baby Feeding: Breast  Finis Bud, M.D. 10/20/2020 7:48 AM

## 2020-10-20 NOTE — Progress Notes (Signed)
Mother discharged.  Discharge instructions given.  Mother verbalizes understanding.  Transported by auxiliary.

## 2020-10-20 NOTE — Lactation Note (Signed)
This note was copied from a baby's chart. Lactation Consultation Note  Patient Name: Jasmin Jackson [id number] Date: 10/20/2020 Reason for consult: Follow-up assessment;Primapara;Term Age:33 hours  Lactation follow-up prior to anticipated discharge. 24/36hr screens and output are within normal ranges for DOL. Mom has been independent with feedings, and just completed a feeding <1hr ago.  Mom continues to note some discomfort/soreness with initial latch that subsides as the feeding progresses, no visible nipple damage or concerns. Mom has been given coconut oil and comfort gels and encouraged to use both alternating.   Reviewed early cues, feeding on demand/not the clock, growth spurts and cluster feeding, and output expectations. Encouraged the delay of introduction of artifical nipples (bottles/pacifiers), the impact they can have on establishment of breastfeeding and milk supply.  Guidance given for anticipated breast changes, management of breast fullness and engorgement, softening of the breast tissue prior to latching, and nipple care.  Information given for outpatient lactation services and community breastfeeding support. Encouraged mom to call with questions/concerns and for ongoing BF support as needed.  Maternal Data Has patient been taught Hand Expression?: Yes Does the patient have breastfeeding experience prior to this delivery?: No  Feeding Mother's Current Feeding Choice: Breast Milk  LATCH Score                    Lactation Tools Discussed/Used    Interventions Interventions: Breast feeding basics reviewed;Pre-pump if needed;Reverse pressure;Education  Discharge Discharge Education: Engorgement and breast care;Warning signs for feeding baby;Outpatient recommendation  Consult Status Consult Status: Complete Date: 10/20/20 Follow-up type: Call as needed    Lavonia Drafts 10/20/2020, 9:59 AM

## 2020-10-25 NOTE — Progress Notes (Signed)
    GYNECOLOGY PROGRESS NOTE  Subjective:    Patient ID: Jasmin Jackson, female    DOB: January 13, 1986, 35 y.o.   MRN: [id number]  HPI  Patient is a 35 y.o. G52P1001 female who presents for one week incision check and blood pressure check. She is 1 week postpartum s/p primary LTCS for breech presentation discovered in labor. Was noted to have labile BPs after delivery.  Notes pain is well managed with current pain medication. Bowel movements are normal.   The patient has the following complaints today.  She has been experiencing some vaginal pain after she urinates. Feels somewhat better after voiding.  She complains of pain in her left breast. Notes infant is cluster feeding.  Is beginning to note some mild redness at the nipple.   The following portions of the patient's history were reviewed and updated as appropriate: allergies, current medications, past family history, past medical history, past social history, past surgical history, and problem list.  Review of Systems Pertinent items noted in HPI and remainder of comprehensive ROS otherwise negative.   Objective:  Blood pressure 108/76, pulse 80, resp. rate 16, height 5\' 7"  (1.702 m), weight 144 lb 1.6 oz (65.4 kg), unknown if currently breastfeeding. Body mass index is 22.57 kg/m.  General appearance: alert and no distress Breasts: lactating, no erythema or tenderness, nipple normal on right. Mild erythema and tenderness of the nipple on left.  Abdomen: soft, non-tender; bowel sounds normal; no masses,  no organomegaly. Incision site healing well, no seroma, erythema, bleeding.  Extremities: extremities normal, atraumatic, no cyanosis or edema   Assessment:   1. Postop check   2. S/P cesarean section   3. Lactating mother   4. Pain with urination      Plan:   Post-op check, s/p C-section - incision healing well. Discussed wound care.  Lactating mother - discussed methods to help with cluster feeding. Is using lanolin but does  not feel it is helping.  Will prescribe all-purpose nipple ointment.  Also advised on pumping on left side q 2-3 hours, and latching on right for feeds for several days. Also discussed possible use of nipple shield on left if still no improvement.  Pain with urination - patient notes she can't give urine sample currently as she recently voided. Discussed increasing water intake, and can use AZO. If still no improvement over next several days, to return and leave urine sample.  Follow up in 5 weeks for postpartum visit.    Jasmin Maid, MD [facility].

## 2020-10-27 ENCOUNTER — Ambulatory Visit (INDEPENDENT_AMBULATORY_CARE_PROVIDER_SITE_OTHER): Payer: 59 | Admitting: Obstetrics and Gynecology

## 2020-10-27 ENCOUNTER — Encounter: Payer: Self-pay | Admitting: Obstetrics and Gynecology

## 2020-10-27 ENCOUNTER — Other Ambulatory Visit: Payer: Self-pay

## 2020-10-27 VITALS — BP 108/76 | HR 80 | Resp 16 | Ht 67.0 in | Wt 144.1 lb

## 2020-10-27 DIAGNOSIS — R309 Painful micturition, unspecified: Secondary | ICD-10-CM

## 2020-10-27 DIAGNOSIS — Z98891 History of uterine scar from previous surgery: Secondary | ICD-10-CM

## 2020-10-27 DIAGNOSIS — Z09 Encounter for follow-up examination after completed treatment for conditions other than malignant neoplasm: Secondary | ICD-10-CM

## 2020-12-01 ENCOUNTER — Encounter: Payer: Self-pay | Admitting: Obstetrics and Gynecology

## 2020-12-01 ENCOUNTER — Ambulatory Visit: Payer: 59 | Admitting: Obstetrics and Gynecology

## 2020-12-01 ENCOUNTER — Other Ambulatory Visit: Payer: Self-pay

## 2020-12-01 DIAGNOSIS — Z23 Encounter for immunization: Secondary | ICD-10-CM | POA: Diagnosis not present

## 2020-12-01 DIAGNOSIS — Z30011 Encounter for initial prescription of contraceptive pills: Secondary | ICD-10-CM | POA: Diagnosis not present

## 2020-12-01 MED ORDER — SLYND 4 MG PO TABS: 1.0000 | ORAL_TABLET | Freq: Every day | ORAL | 3 refills | Status: DC

## 2020-12-01 NOTE — Patient Instructions (Signed)

## 2020-12-01 NOTE — Progress Notes (Signed)
   OBSTETRICS POSTPARTUM CLINIC PROGRESS NOTE  Subjective:     Jasmin Jackson is a 35 y.o. G27P1001 female who presents for a postpartum visit. She is 6 week postpartum following a low cervical transverse Cesarean section (breech presentation, noted in labor). I have fully reviewed the prenatal and intrapartum course. The delivery was at 40 gestational weeks.  Anesthesia: spinal. Postpartum course has been uncomplicated. Baby's course has been uncomplicated. Baby is feeding by breast. Bleeding: patient has not resumed menses, with No LMP recorded.. Bowel function is normal. Bladder function is normal. Patient is not sexually active. Contraception method desired is OCP (estrogen/progesterone). Postpartum depression screening: negative.  EDPS score is 1.    The following portions of the patient's history were reviewed and updated as appropriate: allergies, current medications, past family history, past medical history, past social history, past surgical history, and problem list.  Review of Systems Pertinent items noted in HPI and remainder of comprehensive ROS otherwise negative.   Objective:    BP 111/73   Pulse 67   Resp 16   Ht 5\' 7"  (1.702 m)   Wt 140 lb (63.5 kg)   Breastfeeding Yes   BMI 21.93 kg/m   General:  alert and no distress   Breasts:  inspection negative, no nipple discharge or bleeding, no masses or nodularity palpable  Lungs: clear to auscultation bilaterally  Heart:  regular rate and rhythm, S1, S2 normal, no murmur, click, rub or gallop  Abdomen: soft, non-tender; bowel sounds normal; no masses,  no organomegaly.  Well healed Pfannenstiel incision   Vulva:  normal  Vagina: normal vagina, no discharge, exudate, lesion, or erythema  Cervix:  no cervical motion tenderness and no lesions  Corpus: normal size, contour, position, consistency, mobility, non-tender  Adnexa:  normal adnexa and no mass, fullness, tenderness  Rectal Exam: Not performed.         Labs:  Lab  Results  Component Value Date   HGB 12.4 10/18/2020     Assessment:   1. Postpartum care following cesarean delivery   2. Needs flu shot   3. Encounter for initial prescription of contraceptive pills      Plan:    1. Contraception: OCP (estrogen/progesterone). Discussed that as patient is breastfeeding, would recommend progesterone-only OCP.  Will give sample of Slynd and send prescription.  2. Will check Hgb for h/o postpartum anemia of less than 10.  3. Flu vaccine given today.  4. Follow up in: 4-6 months for annual exam with GYN or PCP, or sooner as needed.    Rubie Maid, MD [facility]

## 2020-12-14 ENCOUNTER — Telehealth: Payer: Self-pay

## 2020-12-14 NOTE — Telephone Encounter (Signed)
I called patient and left her a vm to call the office we need to schedule her for a physical after her 6 weeks postpartum per Minette Brine DNP,FNP-BC. Tyler Deis

## 2021-03-14 DIAGNOSIS — L219 Seborrheic dermatitis, unspecified: Secondary | ICD-10-CM | POA: Diagnosis not present

## 2021-03-14 DIAGNOSIS — L658 Other specified nonscarring hair loss: Secondary | ICD-10-CM | POA: Diagnosis not present

## 2021-03-14 DIAGNOSIS — Z79899 Other long term (current) drug therapy: Secondary | ICD-10-CM | POA: Diagnosis not present

## 2021-03-14 DIAGNOSIS — L089 Local infection of the skin and subcutaneous tissue, unspecified: Secondary | ICD-10-CM | POA: Diagnosis not present

## 2021-03-22 ENCOUNTER — Encounter: Payer: Self-pay | Admitting: Obstetrics and Gynecology

## 2021-03-23 ENCOUNTER — Encounter: Payer: Self-pay | Admitting: Obstetrics and Gynecology

## 2021-03-23 ENCOUNTER — Other Ambulatory Visit: Payer: Self-pay | Admitting: Obstetrics and Gynecology

## 2021-03-23 MED ORDER — SERTRALINE HCL 50 MG PO TABS: 50.0000 mg | ORAL_TABLET | Freq: Every day | ORAL | 3 refills | Status: DC

## 2021-03-24 NOTE — Telephone Encounter (Signed)
Please have patient to f/u in 2-3 weeks for discussion/management of postpartum depression. Can be in person or televisit.

## 2021-04-11 NOTE — Progress Notes (Unsigned)
° ° °  GYNECOLOGY PROGRESS NOTE  Subjective:    Patient ID: Jasmin Jackson, female    DOB: [birth year], 36 y.o.   MRN: [id number]  HPI  Patient is a 36 y.o. G25P1001 female who presents for postpartum depression follow up.  {Common ambulatory SmartLinks:19316}  Review of Systems {ros; complete:30496}   Objective:   currently breastfeeding. There is no height or weight on file to calculate BMI. General appearance: {general exam:16600} Abdomen: {abdominal exam:16834} Pelvic: {pelvic exam:16852::"cervix normal in appearance","external genitalia normal","no adnexal masses or tenderness","no cervical motion tenderness","rectovaginal septum normal","uterus normal size, shape, and consistency","vagina normal without discharge"} Extremities: {extremity exam:5109} Neurologic: {neuro exam:17854}   Assessment:   No diagnosis found.   Plan:   There are no diagnoses linked to this encounter.     Rubie Maid, MD [facility]

## 2021-04-12 ENCOUNTER — Encounter: Payer: Self-pay | Admitting: Obstetrics and Gynecology

## 2021-04-12 ENCOUNTER — Other Ambulatory Visit: Payer: Self-pay

## 2021-04-12 ENCOUNTER — Ambulatory Visit: Payer: 59 | Admitting: Obstetrics and Gynecology

## 2021-04-12 ENCOUNTER — Telehealth: Payer: Self-pay

## 2021-04-12 VITALS — BP 108/78 | HR 79 | Ht 67.0 in | Wt 142.1 lb

## 2021-04-12 DIAGNOSIS — F53 Postpartum depression: Secondary | ICD-10-CM

## 2021-04-12 NOTE — Telephone Encounter (Signed)
Left pt vm, to give the office a call back, provider wants to confirm if she is still coming to [facility] for her primary care needs.  ?

## 2021-05-04 ENCOUNTER — Encounter: Payer: 59 | Admitting: Obstetrics and Gynecology

## 2021-05-13 DIAGNOSIS — J029 Acute pharyngitis, unspecified: Secondary | ICD-10-CM | POA: Diagnosis not present

## 2021-05-14 DIAGNOSIS — H66001 Acute suppurative otitis media without spontaneous rupture of ear drum, right ear: Secondary | ICD-10-CM | POA: Diagnosis not present

## 2021-11-30 DIAGNOSIS — F411 Generalized anxiety disorder: Secondary | ICD-10-CM | POA: Diagnosis not present

## 2021-12-07 ENCOUNTER — Telehealth: Payer: Self-pay

## 2021-12-07 NOTE — Telephone Encounter (Signed)
Pt called triage asking if Dr. Marcelline Mates can send yeast Rx to her pharmacy. She is currently having vaginal itching and some clear discharge. Denies odor. Per protocol, advised pt to use OTC monistat 7 day. If that doesn't work, call our office to schedule appt.

## 2021-12-08 DIAGNOSIS — F411 Generalized anxiety disorder: Secondary | ICD-10-CM | POA: Diagnosis not present

## 2021-12-29 DIAGNOSIS — F411 Generalized anxiety disorder: Secondary | ICD-10-CM | POA: Diagnosis not present

## 2022-01-05 DIAGNOSIS — F411 Generalized anxiety disorder: Secondary | ICD-10-CM | POA: Diagnosis not present

## 2022-03-12 IMAGING — US US BREAST*L* LIMITED INC AXILLA
1 series · 6 of 6 positions shown · non-contrast
Comparison: Previous exam(s).
COMPARISON: Previous exam(s).

Addendum:
CLINICAL DATA: 34-year-old female uncertain if she is pregnant. The
patient had a fibroadenoma biopsied from the left breast in Sunday August, 2014. Follow-up exam.

EXAM:
ULTRASOUND OF THE LEFT BREAST

[Series 1: us breast*left* limited inc axilla · 0.06mm/px · 6 of 6 slices shown]
[im 1/6]
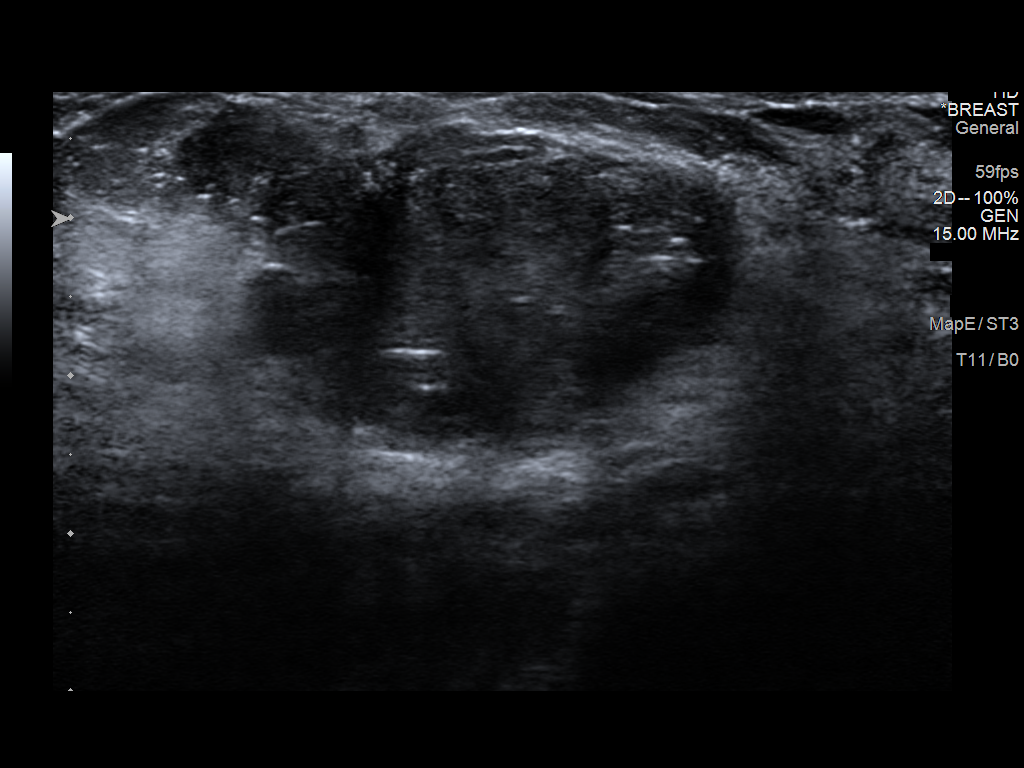
[im 2/6]
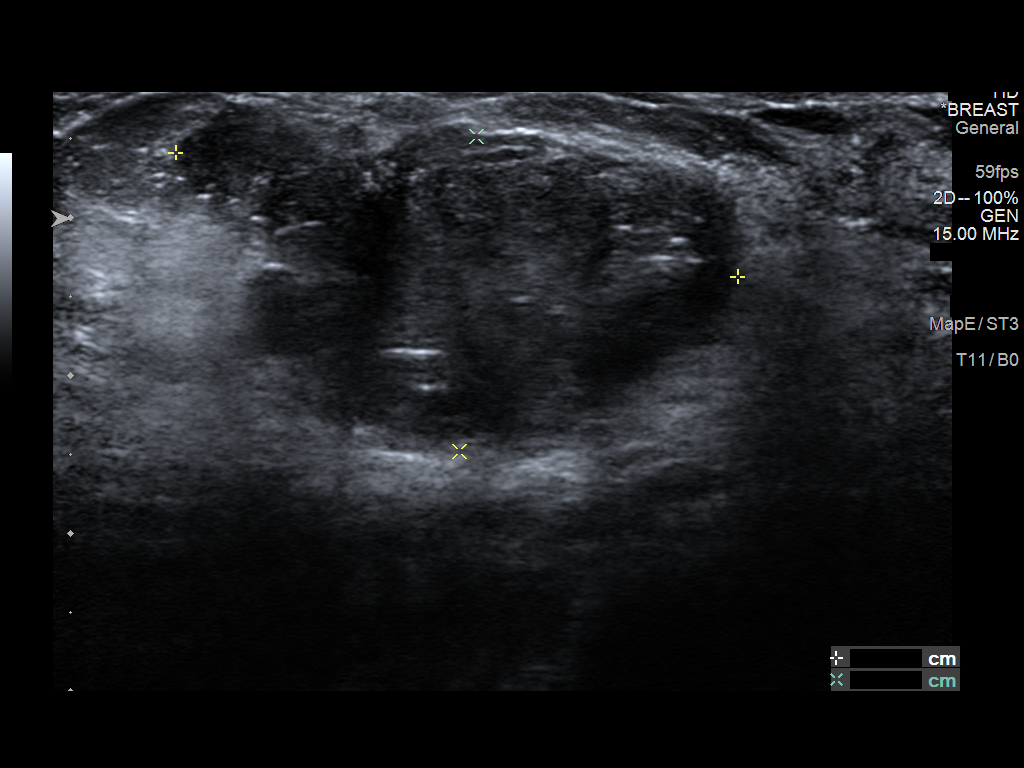
[im 3/6]
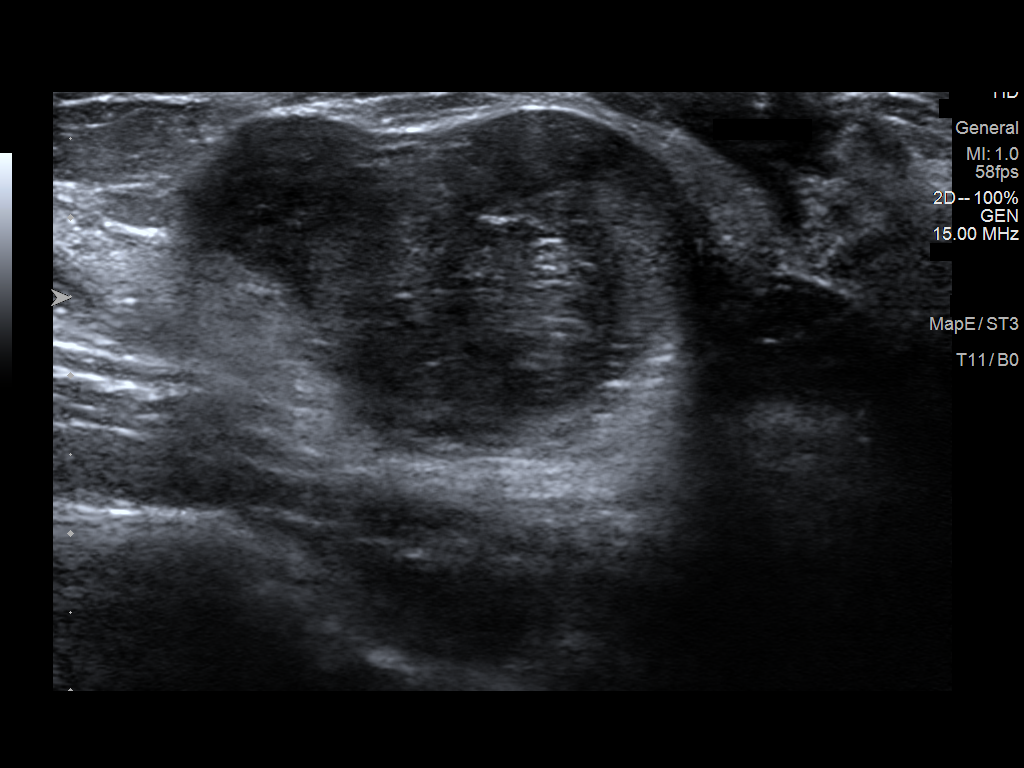
[im 4/6]
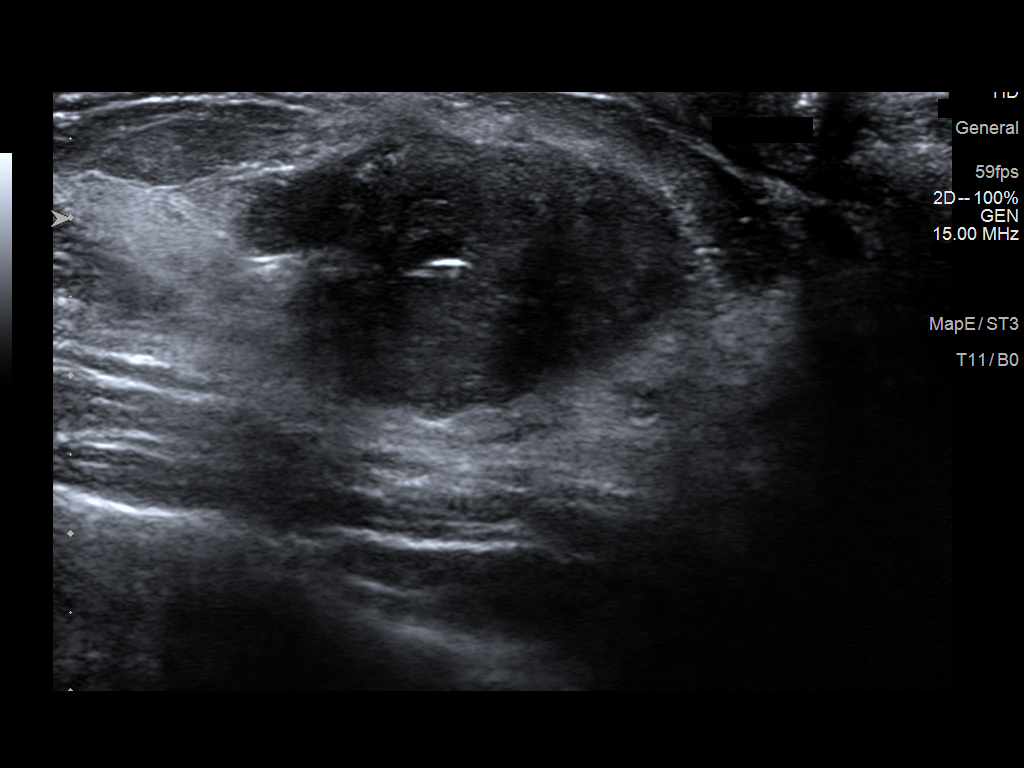
[im 5/6]
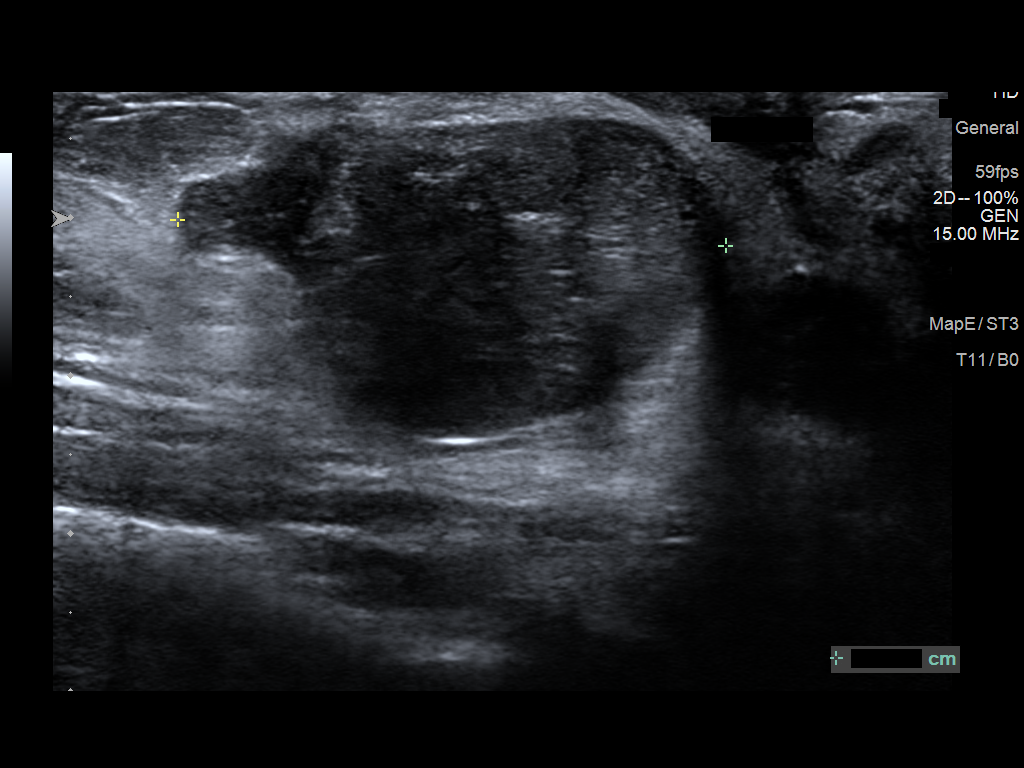
[im 6/6]
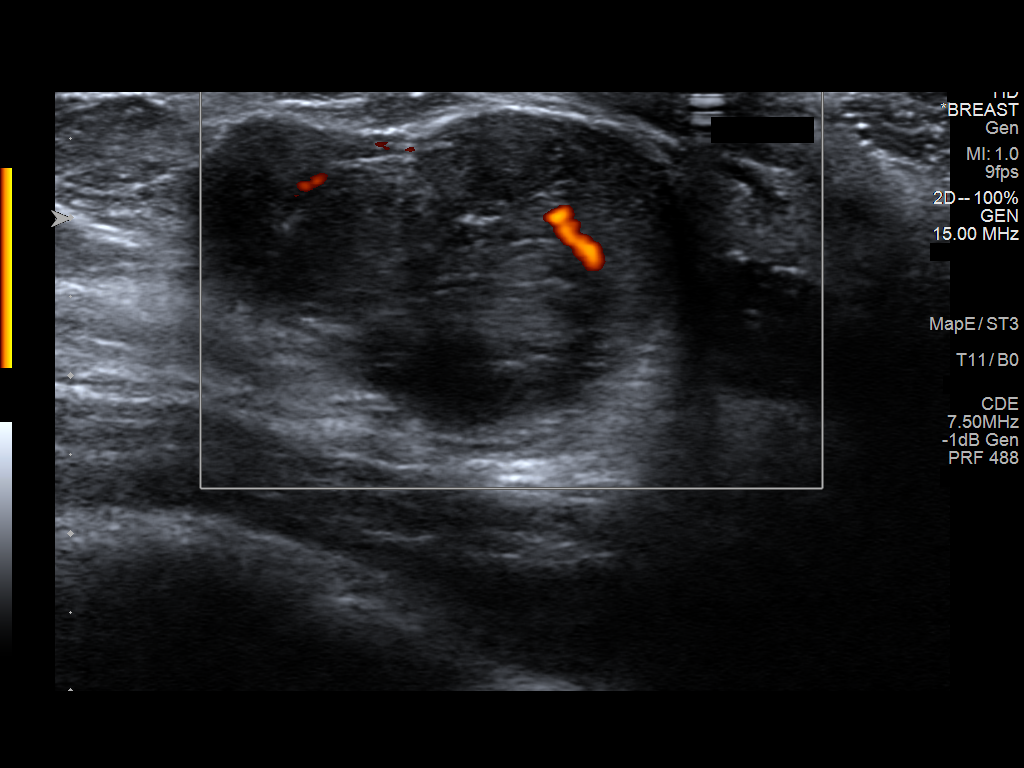

[6 of 6 positions shown; findings below may reference images not displayed]

FINDINGS: On physical exam, I palpate a discrete mass in the 9 o'clock
retroareolar region the left breast.

Targeted ultrasound is performed, showing a lobulated hypoechoic
mass in the 9 o'clock retroareolar region of the left breast
measuring 2.6 x 2.0 x 3.5 cm. On the prior exam dated 08/27/2014 it
measured 2.4 x 1.9 x 3.1 cm.
IMPRESSION: The fibroadenoma in the 9 o'clock retroareolar region of the left
breast measures slightly larger than the prior exam which may be
partly due to technique.

RECOMMENDATION:
Short-term interval follow-up ultrasound in 6 months versus surgical
excision were discussed with the patient. The patient states that
she would like to have a surgical consult.

I have discussed the findings and recommendations with the patient.
If applicable, a reminder letter will be sent to the patient
regarding the next appointment.

BI-RADS CATEGORY  3: Probably benign.

ADDENDUM:
Per patient request, surgical consultation has been arranged with
Dr. Rain Chao Lien at [REDACTED] on December 29, 2019.

Daija Khera RN on 12/09/2019

*** End of Addendum ***
FINDINGS: On physical exam, I palpate a discrete mass in the 9 o'clock
retroareolar region the left breast.

Targeted ultrasound is performed, showing a lobulated hypoechoic
mass in the 9 o'clock retroareolar region of the left breast
measuring 2.6 x 2.0 x 3.5 cm. On the prior exam dated 08/27/2014 it
measured 2.4 x 1.9 x 3.1 cm.
IMPRESSION: The fibroadenoma in the 9 o'clock retroareolar region of the left
breast measures slightly larger than the prior exam which may be
partly due to technique.

RECOMMENDATION:
Short-term interval follow-up ultrasound in 6 months versus surgical
excision were discussed with the patient. The patient states that
she would like to have a surgical consult.

I have discussed the findings and recommendations with the patient.
If applicable, a reminder letter will be sent to the patient
regarding the next appointment.

BI-RADS CATEGORY  3: Probably benign.

## 2022-05-19 ENCOUNTER — Other Ambulatory Visit: Payer: Self-pay

## 2022-05-19 DIAGNOSIS — B379 Candidiasis, unspecified: Secondary | ICD-10-CM

## 2022-05-19 MED ORDER — FLUCONAZOLE 150 MG PO TABS: 150.0000 mg | ORAL_TABLET | Freq: Every day | ORAL | 0 refills | Status: DC

## 2022-05-19 NOTE — Progress Notes (Signed)
Pt called triage c/o vaginal yeast infection., she has not got any relief with monistat, I sent in a diflucan, she was transferred to the front dest to schedule her annual.

## 2022-06-09 ENCOUNTER — Inpatient Hospital Stay (HOSPITAL_COMMUNITY): Payer: Commercial Managed Care - PPO

## 2022-06-09 ENCOUNTER — Encounter (HOSPITAL_COMMUNITY): Payer: Self-pay | Admitting: *Deleted

## 2022-06-09 ENCOUNTER — Inpatient Hospital Stay (HOSPITAL_COMMUNITY)
Admission: AD | Admit: 2022-06-09 | Discharge: 2022-06-09 | Disposition: A | Discharge status: Home / Self Care | Payer: Commercial Managed Care - PPO | Attending: Obstetrics and Gynecology | Admitting: Obstetrics and Gynecology

## 2022-06-09 DIAGNOSIS — F419 Anxiety disorder, unspecified: Secondary | ICD-10-CM | POA: Diagnosis not present

## 2022-06-09 DIAGNOSIS — O26891 Other specified pregnancy related conditions, first trimester: Secondary | ICD-10-CM | POA: Diagnosis not present

## 2022-06-09 DIAGNOSIS — O208 Other hemorrhage in early pregnancy: Secondary | ICD-10-CM

## 2022-06-09 DIAGNOSIS — Z8616 Personal history of COVID-19: Secondary | ICD-10-CM | POA: Diagnosis not present

## 2022-06-09 DIAGNOSIS — R109 Unspecified abdominal pain: Secondary | ICD-10-CM

## 2022-06-09 DIAGNOSIS — Z3A01 Less than 8 weeks gestation of pregnancy: Secondary | ICD-10-CM | POA: Diagnosis not present

## 2022-06-09 DIAGNOSIS — R102 Pelvic and perineal pain: Secondary | ICD-10-CM | POA: Insufficient documentation

## 2022-06-09 DIAGNOSIS — Z79899 Other long term (current) drug therapy: Secondary | ICD-10-CM | POA: Diagnosis not present

## 2022-06-09 DIAGNOSIS — O99341 Other mental disorders complicating pregnancy, first trimester: Secondary | ICD-10-CM | POA: Diagnosis not present

## 2022-06-09 LAB — COMPREHENSIVE METABOLIC PANEL
ALT: 12 U/L (ref 0–44)
AST: 14 U/L — ABNORMAL LOW (ref 15–41)
Albumin: 4.2 g/dL (ref 3.5–5.0)
Alkaline Phosphatase: 57 U/L (ref 38–126)
Anion gap: 10 (ref 5–15)
BUN: 6 mg/dL (ref 6–20)
CO2: 22 mmol/L (ref 22–32)
Calcium: 9.5 mg/dL (ref 8.9–10.3)
Chloride: 103 mmol/L (ref 98–111)
Creatinine, Ser: 0.75 mg/dL (ref 0.44–1.00)
GFR, Estimated: >60 mL/min (ref >60)
Glucose, Bld: 76 mg/dL (ref 70–99)
Potassium: 3.6 mmol/L (ref 3.5–5.1)
Sodium: 135 mmol/L (ref 135–145)
Total Bilirubin: 0.7 mg/dL (ref 0.3–1.2)
Total Protein: 8 g/dL (ref 6.5–8.1)

## 2022-06-09 LAB — URINALYSIS, ROUTINE W REFLEX MICROSCOPIC
Bacteria, UA: NONE SEEN
Bilirubin Urine: NEGATIVE
Glucose, UA: NEGATIVE mg/dL
Ketones, ur: NEGATIVE mg/dL
Leukocytes,Ua: NEGATIVE
Nitrite: NEGATIVE
Protein, ur: NEGATIVE mg/dL
Specific Gravity, Urine: 1.01 (ref 1.005–1.030)
pH: 7 (ref 5.0–8.0)

## 2022-06-09 LAB — CBC WITH DIFFERENTIAL/PLATELET
Abs Immature Granulocytes: 0.01 10*3/uL (ref 0.00–0.07)
Basophils Absolute: 0.1 10*3/uL (ref 0.0–0.1)
Basophils Relative: 1 %
Eosinophils Absolute: 0.1 10*3/uL (ref 0.0–0.5)
Eosinophils Relative: 2 %
HCT: 36.7 % (ref 36.0–46.0)
Hemoglobin: 12.3 g/dL (ref 12.0–15.0)
Immature Granulocytes: 0 %
Lymphocytes Relative: 23 %
Lymphs Abs: 1.1 10*3/uL (ref 0.7–4.0)
MCH: 28.9 pg (ref 26.0–34.0)
MCHC: 33.5 g/dL (ref 30.0–36.0)
MCV: 86.4 fL (ref 80.0–100.0)
Monocytes Absolute: 0.5 10*3/uL (ref 0.1–1.0)
Monocytes Relative: 11 %
Neutro Abs: 3 10*3/uL (ref 1.7–7.7)
Neutrophils Relative %: 63 %
Platelets: 295 10*3/uL (ref 150–400)
RBC: 4.25 MIL/uL (ref 3.87–5.11)
RDW: 14.1 % (ref 11.5–15.5)
WBC: 4.7 10*3/uL (ref 4.0–10.5)
nRBC: 0 % (ref 0.0–0.2)

## 2022-06-09 LAB — WET PREP, GENITAL
Clue Cells Wet Prep HPF POC: NONE SEEN
Sperm: NONE SEEN
Trich, Wet Prep: NONE SEEN
WBC, Wet Prep HPF POC: >=10 — AB (ref <10)
Yeast Wet Prep HPF POC: NONE SEEN

## 2022-06-09 LAB — HIV ANTIBODY (ROUTINE TESTING W REFLEX): HIV Screen 4th Generation wRfx: NONREACTIVE

## 2022-06-09 LAB — POCT PREGNANCY, URINE: Preg Test, Ur: POSITIVE — AB

## 2022-06-09 LAB — ABO/RH: ABO/RH(D): O POS

## 2022-06-09 LAB — HCG, QUANTITATIVE, PREGNANCY: hCG, Beta Chain, Quant, S: 43918 m[IU]/mL — ABNORMAL HIGH (ref <5)

## 2022-06-09 NOTE — MAU Provider Note (Signed)
History     CSN: [id number]  Arrival date and time: 06/09/22 1302   None     Chief Complaint  Patient presents with   Abdominal Pain   HPI  Ms.Jasmin Jackson is a 36 y.o. female G2P1001 @ [redacted]w[redacted]d here in MAU with abdominal pain.  She reports a positive pregnancy test a few days ago. The pain is worse in her right lower abdominal and right upper abdomin. The pain comes and goes. She does not have any bleeding. She has not taken anything for the pain.   OB History     Gravida  2   Para  1   Term  1   Preterm      AB      Living  1      SAB      IAB      Ectopic      Multiple  0   Live Births  1           Past Medical History:  Diagnosis Date   Alopecia    Anxiety    Chlamydia    COVID-19 06/05/2020   home test positive yesterday   Fibroadenoma of right breast    Headache    Migraines   HSV infection    Uterine fibroid 07/29/2020    Past Surgical History:  Procedure Laterality Date   CESAREAN SECTION  10/18/2020   Procedure: CESAREAN SECTION;  Surgeon: Linzie Collin, MD;  Location: [facility] ORS;  Service: Obstetrics;;    Family History  Problem Relation Age of Onset   Liver disease Mother    Heart attack Father     Social History   Tobacco Use   Smoking status: Never   Smokeless tobacco: Never  Vaping Use   Vaping Use: Never used  Substance Use Topics   Alcohol use: Not Currently    Comment: occasionally    Drug use: Never    Allergies: No Known Allergies  Medications Prior to Admission  Medication Sig Dispense Refill Last Dose   Drospirenone (SLYND) 4 MG TABS Take 1 tablet by mouth daily. 84 tablet 3    fluconazole (DIFLUCAN) 150 MG tablet Take 1 tablet (150 mg total) by mouth daily. 1 tablet 0    sertraline (ZOLOFT) 50 MG tablet Take 1 tablet (50 mg total) by mouth daily. 90 tablet 3    Results for orders placed or performed during the hospital encounter of 06/09/22 (from the past 48 hour(s))  Pregnancy, urine POC      Status: Abnormal   Collection Time: 06/09/22  1:33 PM  Result Value Ref Range   Preg Test, Ur POSITIVE (A) NEGATIVE    Comment:        THE SENSITIVITY OF THIS METHODOLOGY IS >24 mIU/mL   Urinalysis, Routine w reflex microscopic -Urine, Clean Catch     Status: Abnormal   Collection Time: 06/09/22  1:38 PM  Result Value Ref Range   Color, Urine YELLOW YELLOW   APPearance CLEAR CLEAR   Specific Gravity, Urine 1.010 1.005 - 1.030   pH 7.0 5.0 - 8.0   Glucose, UA NEGATIVE NEGATIVE mg/dL   Hgb urine dipstick SMALL (A) NEGATIVE   Bilirubin Urine NEGATIVE NEGATIVE   Ketones, ur NEGATIVE NEGATIVE mg/dL   Protein, ur NEGATIVE NEGATIVE mg/dL   Nitrite NEGATIVE NEGATIVE   Leukocytes,Ua NEGATIVE NEGATIVE   RBC / HPF 0-5 0 - 5 RBC/hpf   WBC, UA 0-5 0 - 5 WBC/hpf  Bacteria, UA NONE SEEN NONE SEEN   Squamous Epithelial / HPF 0-5 0 - 5 /HPF   Mucus PRESENT     Comment: Performed at [facility], [address] [place], [place] [address]  Wet prep, genital     Status: Abnormal   Collection Time: 06/09/22  2:31 PM  Result Value Ref Range   Yeast Wet Prep HPF POC NONE SEEN NONE SEEN   Trich, Wet Prep NONE SEEN NONE SEEN   Clue Cells Wet Prep HPF POC NONE SEEN NONE SEEN   WBC, Wet Prep HPF POC >=10 (A) <10   Sperm NONE SEEN     Comment: Performed at [facility], [address] [place], [place] [address]  ABO/Rh     Status: None   Collection Time: 06/09/22  2:34 PM  Result Value Ref Range   ABO/RH(D) O POS    No rh immune globuloin      NOT A RH IMMUNE GLOBULIN CANDIDATE, PT RH POSITIVE Performed at [facility], [address], [place], [place] [address]   CBC with Differential/Platelet     Status: None   Collection Time: 06/09/22  2:38 PM  Result Value Ref Range   WBC 4.7 4.0 - 10.5 K/uL   RBC 4.25 3.87 - 5.11 MIL/uL   Hemoglobin 12.3 12.0 - 15.0 g/dL   HCT 91.4 78.2 - 95.6 %   MCV 86.4 80.0 - 100.0 fL   MCH 28.9 26.0 - 34.0 pg   MCHC 33.5 30.0 - 36.0 g/dL    RDW 21.3 08.6 - 57.8 %   Platelets 295 150 - 400 K/uL   nRBC 0.0 0.0 - 0.2 %   Neutrophils Relative % 63 %   Neutro Abs 3.0 1.7 - 7.7 K/uL   Lymphocytes Relative 23 %   Lymphs Abs 1.1 0.7 - 4.0 K/uL   Monocytes Relative 11 %   Monocytes Absolute 0.5 0.1 - 1.0 K/uL   Eosinophils Relative 2 %   Eosinophils Absolute 0.1 0.0 - 0.5 K/uL   Basophils Relative 1 %   Basophils Absolute 0.1 0.0 - 0.1 K/uL   Immature Granulocytes 0 %   Abs Immature Granulocytes 0.01 0.00 - 0.07 K/uL    Comment: Performed at [facility], [address], [place], [place] [address]  Comprehensive metabolic panel     Status: Abnormal   Collection Time: 06/09/22  2:38 PM  Result Value Ref Range   Sodium 135 135 - 145 mmol/L   Potassium 3.6 3.5 - 5.1 mmol/L   Chloride 103 98 - 111 mmol/L   CO2 22 22 - 32 mmol/L   Glucose, Bld 76 70 - 99 mg/dL    Comment: Glucose reference range applies only to samples taken after fasting for at least 8 hours.   BUN 6 6 - 20 mg/dL   Creatinine, Ser 9.52 0.44 - 1.00 mg/dL   Calcium 9.5 8.9 - 84.1 mg/dL   Total Protein 8.0 6.5 - 8.1 g/dL   Albumin 4.2 3.5 - 5.0 g/dL   AST 14 (L) 15 - 41 U/L   ALT 12 0 - 44 U/L   Alkaline Phosphatase 57 38 - 126 U/L   Total Bilirubin 0.7 0.3 - 1.2 mg/dL   GFR, Estimated >32 >44 mL/min    Comment: (NOTE) Calculated using the CKD-EPI Creatinine Equation (2021)    Anion gap 10 5 - 15    Comment: Performed at [facility], [address] [place], [place] [address]  hCG, quantitative, pregnancy     Status: Abnormal   Collection Time: 06/09/22  2:38 PM  Result Value Ref Range   hCG, Beta Chain, Quant, S 43,918 (H) <5 mIU/mL    Comment:          GEST. AGE      CONC.  (mIU/mL)   <=1 WEEK        5 - 50     2 WEEKS       50 - 500     3 WEEKS       100 - 10,000     4 WEEKS     1,000 - 30,000     5 WEEKS     3,500 - 115,000   6-8 WEEKS     12,000 - 270,000    12 WEEKS     15,000 - 220,000        FEMALE AND NON-PREGNANT FEMALE:      LESS THAN 5 mIU/mL Performed at [facility], [address] [place], [place] [address]     US OB LESS THAN 14 WEEKS WITH [place] TRANSVAGINAL  Result Date: 06/09/2022 CLINICAL DATA:  Pain.  Pregnant EXAM: OBSTETRIC <14 WK [place] AND TRANSVAGINAL OB US TECHNIQUE: Both transabdominal and transvaginal ultrasound examinations were performed for complete evaluation of the gestation as well as the maternal uterus, adnexal regions, and pelvic cul-de-sac. Transvaginal technique was performed to assess early pregnancy. COMPARISON:  None Available. FINDINGS: Intrauterine gestational sac: Single Yolk sac:  Visualized. Embryo:  Not Visualized. Cardiac Activity: Not Visualized. MSD: 11.8 mm   6 w   0 d Subchorionic hemorrhage:  Small subchorionic hemorrhage. Maternal uterus/adnexae: Right ovary has blood flow on Doppler small follicles. The right ovary measures 4.0 x 2.1 x 3.3 cm. Left ovary has a similar appearance and measures 3.8 x 2.3 x 3.0 cm. Trace free fluid in the pelvis. IMPRESSION: Intrauterine pregnancy identified with a yolk sac and small subchorionic hemorrhage. No fetal pole at this time. Recommend follow-up. Estimated gestational age by mean sac diameter of 6 weeks and 0 days. Electronically Signed   By: Karen Kays M.D.   On: 06/09/2022 15:23     Review of Systems  Constitutional:  Negative for fever.  Gastrointestinal:  Positive for abdominal pain.  Genitourinary:  Negative for vaginal bleeding and vaginal discharge.   Physical Exam   Blood pressure 115/67, pulse (!) 107, temperature 98.1 F (36.7 C), resp. rate 18, height 5\' 7"  (1.702 m), weight 64 kg, last menstrual period 04/21/2022, currently breastfeeding.  Physical Exam Constitutional:      General: She is not in acute distress.    Appearance: She is well-developed. She is not ill-appearing, toxic-appearing or diaphoretic.  Abdominal:     Tenderness: There is generalized abdominal tenderness. There is no guarding or rebound.   Skin:    General: Skin is warm.  Neurological:     Mental Status: She is alert and oriented to person, place, and time.  Psychiatric:        Behavior: Behavior normal.    MAU Course  Procedures  MDM  Wet prep & GC HIV, CBC, Hcg, ABO US OB transvaginal    Assessment and Plan   A:  1. Abdominal pain during pregnancy in first trimester   2. Subchorionic hemorrhage of placenta in first trimester   3. [redacted] weeks gestation of pregnancy      P:  Dc home Return  to MAU if symptoms Viability scan ordered for 7-14 days. Take prenatal vitamins daily. Start prenatal care  Deronte Solis, Harolyn Rutherford, NP 06/09/2022 5:28 PM

## 2022-06-09 NOTE — MAU Note (Signed)
.  Jasmin Jackson is a 37 y.o. at Unknown here in MAU reporting: had a positive  HPT last week. Started having some lower abd cramping today. Denies any vag bleeding or discharge,  LMP: 04/21/22 Onset of complaint: today Pain score: 6-7 Vitals:   06/09/22 1350  BP: 115/67  Pulse: (!) 107  Resp: 18  Temp: 98.1 F (36.7 C)     FHT:n/a Lab orders placed from triage:  UPT,U/A

## 2022-06-12 LAB — GC/CHLAMYDIA PROBE AMP (~~LOC~~) NOT AT ARMC
Chlamydia: NEGATIVE
Comment: NEGATIVE
Comment: NORMAL
Neisseria Gonorrhea: NEGATIVE

## 2022-06-21 ENCOUNTER — Telehealth: Payer: Self-pay | Admitting: Obstetrics and Gynecology

## 2022-06-21 NOTE — Telephone Encounter (Signed)
I contacted the patient via phone. Per request of CMA of Dr. Logan Bores. I was advised to contact the patient about future appointment that is scheduled for 5/28 for annual wellness that the patient is currently 8 weeks and needs to be scheduled for follow up OB care. I spoke with the patient seeing if she was wanting to schedule to follow up with pregnancy appointments. The patient isn't sure she is planning to keep this current and wants to keep scheduled annual with Dr. Logan Bores. Please advise?

## 2022-06-27 ENCOUNTER — Ambulatory Visit (INDEPENDENT_AMBULATORY_CARE_PROVIDER_SITE_OTHER): Payer: Commercial Managed Care - PPO | Admitting: Obstetrics and Gynecology

## 2022-06-27 ENCOUNTER — Encounter: Payer: Self-pay | Admitting: Obstetrics and Gynecology

## 2022-06-27 VITALS — BP 104/72 | HR 90 | Ht 67.0 in | Wt 140.7 lb

## 2022-06-27 DIAGNOSIS — Z124 Encounter for screening for malignant neoplasm of cervix: Secondary | ICD-10-CM

## 2022-06-27 DIAGNOSIS — Z01419 Encounter for gynecological examination (general) (routine) without abnormal findings: Secondary | ICD-10-CM

## 2022-06-27 NOTE — Progress Notes (Signed)
Na

## 2022-08-07 ENCOUNTER — Other Ambulatory Visit: Payer: Self-pay

## 2022-10-11 DIAGNOSIS — F411 Generalized anxiety disorder: Secondary | ICD-10-CM | POA: Diagnosis not present

## 2022-10-25 DIAGNOSIS — F411 Generalized anxiety disorder: Secondary | ICD-10-CM | POA: Diagnosis not present

## 2022-11-08 ENCOUNTER — Ambulatory Visit: Payer: Commercial Managed Care - PPO | Admitting: Obstetrics and Gynecology

## 2022-11-08 DIAGNOSIS — F411 Generalized anxiety disorder: Secondary | ICD-10-CM | POA: Diagnosis not present

## 2022-11-15 ENCOUNTER — Encounter: Payer: Self-pay | Admitting: Obstetrics and Gynecology

## 2022-11-15 ENCOUNTER — Other Ambulatory Visit (HOSPITAL_COMMUNITY)
Admission: RE | Admit: 2022-11-15 | Discharge: 2022-11-15 | Disposition: A | Discharge status: Home / Self Care | Payer: BC Managed Care – PPO | Source: Ambulatory Visit | Attending: Obstetrics and Gynecology | Admitting: Obstetrics and Gynecology

## 2022-11-15 ENCOUNTER — Ambulatory Visit (INDEPENDENT_AMBULATORY_CARE_PROVIDER_SITE_OTHER): Payer: BC Managed Care – PPO | Admitting: Obstetrics and Gynecology

## 2022-11-15 VITALS — BP 109/83 | HR 83 | Resp 16 | Ht 67.0 in | Wt 143.5 lb

## 2022-11-15 DIAGNOSIS — Z01419 Encounter for gynecological examination (general) (routine) without abnormal findings: Secondary | ICD-10-CM | POA: Insufficient documentation

## 2022-11-15 DIAGNOSIS — Z131 Encounter for screening for diabetes mellitus: Secondary | ICD-10-CM | POA: Diagnosis not present

## 2022-11-15 DIAGNOSIS — Z13220 Encounter for screening for lipoid disorders: Secondary | ICD-10-CM | POA: Diagnosis not present

## 2022-11-15 DIAGNOSIS — Z124 Encounter for screening for malignant neoplasm of cervix: Secondary | ICD-10-CM | POA: Insufficient documentation

## 2022-11-15 DIAGNOSIS — Z7689 Persons encountering health services in other specified circumstances: Secondary | ICD-10-CM

## 2022-11-15 DIAGNOSIS — B3731 Acute candidiasis of vulva and vagina: Secondary | ICD-10-CM

## 2022-11-15 DIAGNOSIS — N920 Excessive and frequent menstruation with regular cycle: Secondary | ICD-10-CM

## 2022-11-15 NOTE — Patient Instructions (Signed)
Preventive Care 21-35 Years Old, Female Preventive care refers to lifestyle choices and visits with your health care provider that can promote health and wellness. Preventive care visits are also called wellness exams. What can I expect for my preventive care visit? Counseling During your preventive care visit, your health care provider may ask about your: Medical history, including: Past medical problems. Family medical history. Pregnancy history. Current health, including: Menstrual cycle. Method of birth control. Emotional well-being. Home life and relationship well-being. Sexual activity and sexual health. Lifestyle, including: Alcohol, nicotine or tobacco, and drug use. Access to firearms. Diet, exercise, and sleep habits. Work and work environment. Sunscreen use. Safety issues such as seatbelt and bike helmet use. Physical exam Your health care provider may check your: Height and weight. These may be used to calculate your BMI (body mass index). BMI is a measurement that tells if you are at a healthy weight. Waist circumference. This measures the distance around your waistline. This measurement also tells if you are at a healthy weight and may help predict your risk of certain diseases, such as type 2 diabetes and high blood pressure. Heart rate and blood pressure. Body temperature. Skin for abnormal spots. What immunizations do I need?  Vaccines are usually given at various ages, according to a schedule. Your health care provider will recommend vaccines for you based on your age, medical history, and lifestyle or other factors, such as travel or where you work. What tests do I need? Screening Your health care provider may recommend screening tests for certain conditions. This may include: Pelvic exam and Pap test. Lipid and cholesterol levels. Diabetes screening. This is done by checking your blood sugar (glucose) after you have not eaten for a while (fasting). Hepatitis  B test. Hepatitis C test. HIV (human immunodeficiency virus) test. STI (sexually transmitted infection) testing, if you are at risk. BRCA-related cancer screening. This may be done if you have a family history of breast, ovarian, tubal, or peritoneal cancers. Talk with your health care provider about your test results, treatment options, and if necessary, the need for more tests. Follow these instructions at home: Eating and drinking  Eat a healthy diet that includes fresh fruits and vegetables, whole grains, lean protein, and low-fat dairy products. Take vitamin and mineral supplements as recommended by your health care provider. Do not drink alcohol if: Your health care provider tells you not to drink. You are pregnant, may be pregnant, or are planning to become pregnant. If you drink alcohol: Limit how much you have to 0-1 drink a day. Know how much alcohol is in your drink. In the [place] one drink equals one 12 oz bottle of beer (355 mL), one 5 oz glass of wine (148 mL), or one 1 oz glass of hard liquor (44 mL). Lifestyle Brush your teeth every morning and night with fluoride toothpaste. Floss one time each day. Exercise for at least 30 minutes 5 or more days each week. Do not use any products that contain nicotine or tobacco. These products include cigarettes, chewing tobacco, and vaping devices, such as e-cigarettes. If you need help quitting, ask your health care provider. Do not use drugs. If you are sexually active, practice safe sex. Use a condom or other form of protection to prevent STIs. If you do not wish to become pregnant, use a form of birth control. If you plan to become pregnant, see your health care provider for a prepregnancy visit. Find healthy ways to manage stress, such as: Meditation,   yoga, or listening to music. Journaling. Talking to a trusted person. Spending time with friends and family. Minimize exposure to UV radiation to reduce your risk of skin  cancer. Safety Always wear your seat belt while driving or riding in a vehicle. Do not drive: If you have been drinking alcohol. Do not ride with someone who has been drinking. If you have been using any mind-altering substances or drugs. While texting. When you are tired or distracted. Wear a helmet and other protective equipment during sports activities. If you have firearms in your house, make sure you follow all gun safety procedures. Seek help if you have been physically or sexually abused. What's next? Go to your health care provider once a year for an annual wellness visit. Ask your health care provider how often you should have your eyes and teeth checked. Stay up to date on all vaccines. This information is not intended to replace advice given to you by your health care provider. Make sure you discuss any questions you have with your health care provider. Document Revised: 07/14/2020 Document Reviewed: 07/14/2020 Elsevier Patient Education  2024 Else[employer] Inc. Breast Self-Awareness Breast self-awareness is knowing how your breasts look and feel. You need to: Check your breasts on a regular basis. Tell your doctor about any changes. Become familiar with the look and feel of your breasts. This can help you catch a breast problem while it is still small and can be treated. You should do breast self-exams even if you have breast implants. What you need: A mirror. A well-lit room. A pillow or other soft object. How to do a breast self-exam Follow these steps to do a breast self-exam: Look for changes  Take off all the clothes above your waist. Stand in front of a mirror in a room with good lighting. Put your hands down at your sides. Compare your breasts in the mirror. Look for any difference between them, such as: A difference in shape. A difference in size. Wrinkles, dips, and bumps in one breast and not the other. Look at each breast for changes in the skin, such  as: Redness. Scaly areas. Skin that has gotten thicker. Dimpling. Open sores (ulcers). Look for changes in your nipples, such as: Fluid coming out of a nipple. Fluid around a nipple. Bleeding. Dimpling. Redness. A nipple that looks pushed in (retracted), or that has changed position. Feel for changes Lie on your back. Feel each breast. To do this: Pick a breast to feel. Place a pillow under the shoulder closest to that breast. Put the arm closest to that breast behind your head. Feel the nipple area of that breast using the hand of your other arm. Feel the area with the pads of your three middle fingers by making small circles with your fingers. Use light, medium, and firm pressure. Continue the overlapping circles, moving downward over the breast. Keep making circles with your fingers. Stop when you feel your ribs. Start making circles with your fingers again, this time going upward until you reach your collarbone. Then, make circles outward across your breast and into your armpit area. Squeeze your nipple. Check for discharge and lumps. Repeat these steps to check your other breast. Sit or stand in the tub or shower. With soapy water on your skin, feel each breast the same way you did when you were lying down. Write down what you find Writing down what you find can help you remember what to tell your doctor. Write down: What is   normal for each breast. Any changes you find in each breast. These include: The kind of changes you find. A tender or painful breast. Any lump you find. Write down its size and where it is. When you last had your monthly period (menstrual cycle). General tips If you are breastfeeding, the best time to check your breasts is after you feed your baby or after you use a breast pump. If you get monthly bleeding, the best time to check your breasts is 5-7 days after your monthly cycle ends. With time, you will become comfortable with the self-exam. You will  also start to know if there are changes in your breasts. Contact a doctor if: You see a change in the shape or size of your breasts or nipples. You see a change in the skin of your breast or nipples, such as red or scaly skin. You have fluid coming from your nipples that is not normal. You find a new lump or thick area. You have breast pain. You have any concerns about your breast health. Summary Breast self-awareness includes looking for changes in your breasts and feeling for changes within your breasts. You should do breast self-awareness in front of a mirror in a well-lit room. If you get monthly periods (menstrual cycles), the best time to check your breasts is 5-7 days after your period ends. Tell your doctor about any changes you see in your breasts. Changes include changes in size, changes on the skin, painful or tender breasts, or fluid from your nipples that is not normal. This information is not intended to replace advice given to you by your health care provider. Make sure you discuss any questions you have with your health care provider. Document Revised: 06/23/2021 Document Reviewed: 11/18/2020 Elsevier Patient Education  2024 Elsevier Inc.  

## 2022-11-15 NOTE — Progress Notes (Signed)
GYNECOLOGY ANNUAL PHYSICAL EXAM PROGRESS NOTE  Subjective:    Jasmin Jackson is a 37 y.o. G50P1011 female who presents for an annual exam.  The patient is sexually active. The patient participates in regular exercise: no. Has the patient ever been transfused or tattooed?: no. The patient reports that there is not domestic violence in her life.   The patient has the following complaints today: Reports that cycles have become heavier since the birth of her child several years ago.  Wonders if this is normal. Cycles are still manageable at this time.  Notes she has been having recurrent yeast infections.  Thinks she gets an infection approximately every 2-3 months.  Usually attempts to treat with over the counter medication, however once has had to call the office for a prescription.   Menstrual History: Menarche age: 37 Patient's last menstrual period was 10/23/2022. Period Cycle (Days): 28 Period Duration (Days): 5 Period Pattern: Regular Menstrual Flow: Heavy Menstrual Control: Maxi pad, Tampon Menstrual Control Change Freq (Hours): 2 Dysmenorrhea: (!) Moderate Dysmenorrhea Symptoms: Cramping   Gynecologic History:  Contraception: none History of STI's: HSV, Chlamydia Last Pap: 11/12/2017. Results were: normal. Denies h/o abnormal pap smears. Last mammogram: Not age appropriate    Upstream - 11/15/22 0825       Pregnancy Intention Screening   Does the patient want to become pregnant in the next year? Yes    Does the patient's partner want to become pregnant in the next year? Yes    Would the patient like to discuss contraceptive options today? No      Contraception Wrap Up   Current Method Pregnant/Seeking Pregnancy    End Method Pregnant/Seeking Pregnancy    Contraception Counseling Provided No    How was the end contraceptive method provided? N/A            The pregnancy intention screening data noted above was reviewed. Potential methods of contraception were  discussed. The patient elected to proceed with Pregnant/Seeking Pregnancy.   OB History  Gravida Para Term Preterm AB Living  2 1 1  0 1 1  SAB IAB Ectopic Multiple Live Births  0 1 0 0 1    # Outcome Date GA Lbr Len/2nd Weight Sex Type Anes PTL Lv  2 IAB 05/2022          1 Term 10/18/20 [redacted]w[redacted]d  6 lb 15.1 oz (3.15 kg)  CS-LTranv Spinal  LIV     Name: [name],[name]     Apgar1: 7  Apgar5: 9    Past Medical History:  Diagnosis Date   Alopecia    Anxiety    Chlamydia    COVID-19 06/05/2020   home test positive yesterday   Fibroadenoma of right breast    Headache    Migraines   HSV infection    Uterine fibroid 07/29/2020    Past Surgical History:  Procedure Laterality Date   CESAREAN SECTION  10/18/2020   Procedure: CESAREAN SECTION;  Surgeon: Linzie Collin, MD;  Location: [facility] ORS;  Service: Obstetrics;;    Family History  Problem Relation Age of Onset   Liver disease Mother    Heart attack Father     Social History   Socioeconomic History   Marital status: Married    Spouse name: Not on file   Number of children: Not on file   Years of education: Not on file   Highest education level: Not on file  Occupational History   Not on file  Tobacco Use   Smoking status: Never   Smokeless tobacco: Never  Vaping Use   Vaping status: Never Used  Substance and Sexual Activity   Alcohol use: Not Currently    Comment: occasionally    Drug use: Never   Sexual activity: Yes    Birth control/protection: Condom  Other Topics Concern   Not on file  Social History Narrative   Not on file   Social Determinants of Health   Financial Resource Strain: Not on file  Food Insecurity: Not on file  Transportation Needs: Not on file  Physical Activity: Not on file  Stress: Not on file  Social Connections: Not on file  Intimate Partner Violence: Not on file    No current outpatient medications on file prior to visit.   No current facility-administered medications  on file prior to visit.    No Known Allergies   Review of Systems Constitutional: negative for chills, fatigue, fevers and sweats Eyes: negative for irritation, redness and visual disturbance Ears, nose, mouth, throat, and face: negative for hearing loss, nasal congestion, snoring and tinnitus Respiratory: negative for asthma, cough, sputum Cardiovascular: negative for chest pain, dyspnea, exertional chest pressure/discomfort, irregular heart beat, palpitations and syncope Gastrointestinal: negative for abdominal pain, change in bowel habits, nausea and vomiting Genitourinary: negative for abnormal menstrual periods, genital lesions, sexual problems and vaginal discharge, dysuria and urinary incontinence Integument/breast: negative for breast lump, breast tenderness and nipple discharge Hematologic/lymphatic: negative for bleeding and easy bruising Musculoskeletal:negative for back pain and muscle weakness Neurological: negative for dizziness, headaches, vertigo and weakness Endocrine: negative for diabetic symptoms including polydipsia, polyuria and skin dryness Allergic/Immunologic: negative for hay fever and urticaria      Objective:  Blood pressure 109/83, pulse 83, resp. rate 16, height 5\' 7"  (1.702 m), weight 143 lb 8 oz (65.1 kg), last menstrual period 10/23/2022.  Body mass index is 22.48 kg/m.    General Appearance:    Alert, cooperative, no distress, appears stated age  Head:    Normocephalic, without obvious abnormality, atraumatic  Eyes:    PERRL, conjunctiva/corneas clear, EOM's intact, both eyes  Ears:    Normal external ear canals, both ears  Nose:   Nares normal, septum midline, mucosa normal, no drainage or sinus tenderness  Throat:   Lips, mucosa, and tongue normal; teeth and gums normal  Neck:   Supple, symmetrical, trachea midline, no adenopathy; thyroid: no enlargement/tenderness/nodules; no carotid bruit or JVD  Back:     Symmetric, no curvature, ROM normal, no  CVA tenderness  Lungs:     Clear to auscultation bilaterally, respirations unlabored  Chest Wall:    No tenderness or deformity   Heart:    Regular rate and rhythm, S1 and S2 normal, no murmur, rub or gallop  Breast Exam:    No tenderness, masses, or nipple abnormality  Abdomen:     Soft, non-tender, bowel sounds active all four quadrants, no masses, no organomegaly.  Faint well-healed Pfannenstiel incision.   Genitalia:    Pelvic:external genitalia normal, vagina without lesions, scant curdy gray discharge without odor. Rectovaginal septum  normal. Cervix normal in appearance, no cervical motion tenderness, no adnexal masses or tenderness.  Uterus normal size, shape, mobile, regular contours, nontender.  Rectal:    Normal external sphincter.  No hemorrhoids appreciated. Internal exam not done.   Extremities:   Extremities normal, atraumatic, no cyanosis or edema  Pulses:   2+ and symmetric all extremities  Skin:   Skin color, texture, turgor  normal, no rashes or lesions  Lymph nodes:   Cervical, supraclavicular, and axillary nodes normal  Neurologic:   CNII-XII intact, normal strength, sensation and reflexes throughout   .  Labs:  Lab Results  Component Value Date   WBC 4.7 06/09/2022   HGB 12.3 06/09/2022   HCT 36.7 06/09/2022   MCV 86.4 06/09/2022   PLT 295 06/09/2022    Lab Results  Component Value Date   CREATININE 0.75 06/09/2022   BUN 6 06/09/2022   NA 135 06/09/2022   K 3.6 06/09/2022   CL 103 06/09/2022   CO2 22 06/09/2022    Lab Results  Component Value Date   ALT 12 06/09/2022   AST 14 (L) 06/09/2022   ALKPHOS 57 06/09/2022   BILITOT 0.7 06/09/2022    No results found for: "TSH"   Assessment:   1. Well woman exam with routine gynecological exam   2. Screening cholesterol level   3. Screening for diabetes mellitus (DM)   4. Cervical cancer screening   5. Recurrent candidiasis of vagina   6. Menorrhagia with regular cycle   7. Encounter to establish  care      Plan:  - Blood tests: HgbA1c, TSH, and Lipid panel ordered. - Breast self exam technique reviewed and patient encouraged to perform self-exam monthly. - Contraception: none.  Notes she and partner may be considering attempting conception within the next 6 months.  - Discussed healthy lifestyle modifications. - Mammogram  Not age appropriate - Pap smear ordered. - Flu vaccine: Declined - Menorrhagia with regular cycles, declines interventions at this time.  - Recurrent yeast vaginitis, discussed hygiene, dietary recommendations to decrease risk. Also advised on use of OTC supplements such as tea tree and boric acid suppositories after menstrual cycles.  - Patient desires to establish with a more local PCP. Referral placed.  - Follow up in 1 year for annual exam     Hildred Laser, MD  [facility]

## 2022-11-16 LAB — TSH: TSH: 1.55 u[IU]/mL (ref 0.450–4.500)

## 2022-11-16 LAB — LIPID PANEL
Chol/HDL Ratio: 3.2 {ratio} (ref 0.0–4.4)
Cholesterol, Total: 158 mg/dL (ref 100–199)
HDL: 49 mg/dL (ref >39)
LDL Chol Calc (NIH): 95 mg/dL (ref 0–99)
Triglycerides: 69 mg/dL (ref 0–149)
VLDL Cholesterol Cal: 14 mg/dL (ref 5–40)

## 2022-11-16 LAB — HEMOGLOBIN A1C
Est. average glucose Bld gHb Est-mCnc: 94 mg/dL
Hgb A1c MFr Bld: 4.9 % (ref 4.8–5.6)

## 2022-11-22 LAB — CYTOLOGY - PAP
Comment: NEGATIVE
Diagnosis: NEGATIVE
High risk HPV: NEGATIVE

## 2022-12-06 DIAGNOSIS — F411 Generalized anxiety disorder: Secondary | ICD-10-CM | POA: Diagnosis not present

## 2022-12-18 DIAGNOSIS — F411 Generalized anxiety disorder: Secondary | ICD-10-CM | POA: Diagnosis not present

## 2022-12-21 DIAGNOSIS — F4320 Adjustment disorder, unspecified: Secondary | ICD-10-CM | POA: Diagnosis not present

## 2023-01-04 DIAGNOSIS — F4320 Adjustment disorder, unspecified: Secondary | ICD-10-CM | POA: Diagnosis not present

## 2023-01-08 ENCOUNTER — Ambulatory Visit: Payer: BC Managed Care – PPO | Admitting: Family Medicine

## 2023-01-08 ENCOUNTER — Ambulatory Visit (INDEPENDENT_AMBULATORY_CARE_PROVIDER_SITE_OTHER): Payer: BC Managed Care – PPO

## 2023-01-08 ENCOUNTER — Encounter: Payer: Self-pay | Admitting: Family Medicine

## 2023-01-08 VITALS — BP 100/58 | HR 76 | Resp 97 | Ht 67.0 in | Wt 139.4 lb

## 2023-01-08 VITALS — BP 104/63 | HR 66 | Ht 67.0 in | Wt 139.6 lb

## 2023-01-08 DIAGNOSIS — Z0000 Encounter for general adult medical examination without abnormal findings: Secondary | ICD-10-CM | POA: Diagnosis not present

## 2023-01-08 DIAGNOSIS — R109 Unspecified abdominal pain: Secondary | ICD-10-CM | POA: Insufficient documentation

## 2023-01-08 DIAGNOSIS — Z3201 Encounter for pregnancy test, result positive: Secondary | ICD-10-CM

## 2023-01-08 DIAGNOSIS — R195 Other fecal abnormalities: Secondary | ICD-10-CM | POA: Diagnosis not present

## 2023-01-08 DIAGNOSIS — N912 Amenorrhea, unspecified: Secondary | ICD-10-CM

## 2023-01-08 HISTORY — DX: Encounter for general adult medical examination without abnormal findings: Z00.00

## 2023-01-08 LAB — POCT URINE PREGNANCY: Preg Test, Ur: POSITIVE — AB

## 2023-01-08 NOTE — Patient Instructions (Addendum)
It was a pleasure meeting you today!  Welcome to [facility]  I look forward to taking part in your care as your new primary care physician.    Summary of our discussion today:   Please see the attached document for foods that may contribute to your symptoms  I recommend dietary fiber such as metamucil to help with stools  Continue your prenatal vitamin and follow up with your obstetrician for prenatal care    Please remember to schedule your annual physical one year from your last physical.   You should return to our clinic as needed   Best Wishes,   Dr. Roxan Hockey    VISIT SUMMARY:  Today, we discussed your ongoing gastrointestinal discomfort and your recent positive pregnancy test. We reviewed your symptoms, medical history, and current medications. We also talked about general health maintenance and the importance of regular check-ups and vaccinations.  YOUR PLAN:  -IRRITABLE BOWEL SYNDROME (IBS): IBS is a chronic digestive condition that causes bloating, discomfort, and diarrhea. We discussed that IBS can be managed with dietary changes, especially during pregnancy when medications are not preferred. You should follow a FODMAP diet, increase your fiber intake, stay hydrated, and keep a symptom and food diary. If your symptoms persist or worsen, we may consider referring you to a gastrointestinal specialist.  -GENERAL HEALTH MAINTENANCE: You are currently pregnant and taking prenatal vitamins. We discussed the importance of annual physical exams, vaccinations, and screenings, including flu vaccination, mammograms, and colonoscopies as per guidelines. Continue taking your prenatal vitamins and schedule your annual physical exams.  INSTRUCTIONS:  Please follow up as needed and use MyChart for communication and lab results.

## 2023-01-08 NOTE — Assessment & Plan Note (Signed)
Currently pregnant and taking prenatal vitamins. No significant past medical history except for a previous C-section. Discussed the importance of annual physical exams, vaccinations, and screenings, including flu vaccination, mammograms, and colonoscopies as per guidelines. - Schedule annual physical exams - Recommend flu vaccine - Continue prenatal vitamins

## 2023-01-08 NOTE — Progress Notes (Signed)
    NURSE VISIT NOTE  Subjective:    Patient ID: Jasmin Jackson, female    DOB: 27-Aug-1985, 37 y.o.   MRN: [id number]  HPI  Patient is a 37 y.o. G30P1011 female who presents for evaluation of amenorrhea. She believes she could be pregnant. Pregnancy is desired. Sexual Activity: single partner, contraception: none. Current symptoms also include: nausea and positive home pregnancy test. Last period was normal.    Objective:    BP 104/63   Pulse 66   Ht 5\' 7"  (1.702 m)   Wt 139 lb 9.6 oz (63.3 kg)   LMP 11/19/2022   Breastfeeding No   BMI 21.86 kg/m   Lab Review  Results for orders placed or performed in visit on 01/08/23  POCT urine pregnancy  Result Value Ref Range   Preg Test, Ur Positive (A) Negative    Assessment:   1. Absence of menstruation     Plan:   Pregnancy Test: Positive  Estimated Date of Delivery: 02/02/23 BP Cuff Measurement taken. Cuff Size Adult Small Encouraged well-balanced diet, plenty of rest when needed, pre-natal vitamins daily and walking for exercise.  Discussed self-help for nausea, avoiding OTC medications until consulting provider or pharmacist, other than Tylenol as needed, minimal caffeine (1-2 cups daily) and avoiding alcohol.   She will schedule her nurse visit @ 7-[redacted] wks pregnant, u/s for dating @10  wk, and NOB visit at [redacted] wk pregnant.    Feel free to call with any questions.    Fonda Kinder, CMA

## 2023-01-08 NOTE — Assessment & Plan Note (Signed)
Chronic digestive issues characterized by bloating, discomfort, and diarrhea-predominant symptoms for the past few months. Normal metabolic panel, cholesterol, CBC, A1c, and thyroid levels. Differential diagnosis includes food sensitivities or IBS. Discussed the impact of pregnancy on gastrointestinal symptoms and dietary adjustments. Explained that IBS can be managed with dietary changes and medications are not preferred during pregnancy. Discussed the FODMAP diet, fiber intake, hydration, and keeping a symptom and food diary. - Recommend FODMAP diet - Advise keeping a symptom and food diary - Increase fiber intake - Ensure adequate hydration - Consider referral to GI specialist if symptoms persist or worsen

## 2023-01-08 NOTE — Patient Instructions (Signed)

## 2023-01-08 NOTE — Progress Notes (Signed)
New patient visit   Patient: Jasmin Jackson   DOB: [birth year]   37 y.o. Female  MRN: [id number] Visit Date: 01/08/2023  Today's healthcare provider: Ronnald Ramp, MD   Chief Complaint  Patient presents with   Establish Care   Subjective    Jasmin Jackson is a 36 y.o. female who presents today as a new patient to establish care.      Discussed the use of AI scribe software for clinical note transcription with the patient, who gave verbal consent to proceed.  History of Present Illness   The patient, a 37 year old female, presents to establish care and discuss ongoing gastrointestinal discomfort. She reports a few months of persistent bloating and discomfort, particularly after eating, and frequent loose bowel movements. The discomfort is described as a constant sensation of bloating and general unease, which has been severe enough to prompt the patient to seek medical advice. The patient denies any significant past medical history, with her only hospitalization being for childbirth.  The patient also reports a recent positive pregnancy test, which was planned and intentional. She has a history of one previous pregnancy, which resulted in a C-section. She is currently only taking prenatal vitamins and has no known drug allergies.  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 She reports no significant family history of gastrointestinal disorders. The patient has not yet tried any dietary modifications to manage her symptoms. She has attempted to make small changes, such as reducing coffee intake, but has not noticed any significant improvement.  The patient's symptoms have been ongoing for several months, and she expresses concern about the impact on her quality of life. She has not noticed any correlation between specific foods and symptom exacerbation but is open to dietary modifications to manage her symptoms.          Past Medical History:   Diagnosis Date   Alopecia    Anxiety    Chlamydia    COVID-19 06/05/2020   home test positive yesterday   Fibroadenoma of right breast    Headache    Migraines   Healthcare maintenance 01/08/2023   HSV infection    Uterine fibroid 07/29/2020    Outpatient Medications Prior to Visit  Medication Sig   Prenatal Vit-Fe Fumarate-FA (PRENATAL VITAMIN PO) Take 2 capsules by mouth daily.   No facility-administered medications prior to visit.    Past Surgical History:  Procedure Laterality Date   CESAREAN SECTION  10/18/2020   Procedure: CESAREAN SECTION;  Surgeon: Linzie Collin, MD;  Location: [facility] ORS;  Service: Obstetrics;;   Family Status  Relation Name Status   Mother Angelique Blonder Deceased   Father Jonny Ruiz Alive   Brother Location manager Alive  No partnership data on file   Family History  Problem Relation Age of Onset   Liver disease Mother    Early death Mother    Heart attack Father    Social History   Socioeconomic History   Marital status: Married    Spouse name: Not on file   Number of children: Not on file   Years of education: Not on file   Highest education level: Master's degree (e.g., MA, MS, MEng, MEd, MSW, MBA)  Occupational History   Not on file  Tobacco Use   Smoking status: Never   Smokeless tobacco: Never  Vaping Use   Vaping status: Never Used  Substance and Sexual Activity   Alcohol use: Not Currently    Comment: Socially  Drug use: Never   Sexual activity: Yes    Birth control/protection: None  Other Topics Concern   Not on file  Social History Narrative   Not on file   Social Determinants of Health   Financial Resource Strain: Low Risk  (01/04/2023)   Overall Financial Resource Strain (CARDIA)    Difficulty of Paying Living Expenses: Not hard at all  Food Insecurity: No Food Insecurity (01/04/2023)   Hunger Vital Sign    Worried About Running Out of Food in the Last Year: Never true    Ran Out of Food in the Last Year: Never true   Transportation Needs: No Transportation Needs (01/04/2023)   PRAPARE - Administrator, Civil Service (Medical): No    Lack of Transportation (Non-Medical): No  Physical Activity: Insufficiently Active (01/04/2023)   Exercise Vital Sign    Days of Exercise per Week: 2 days    Minutes of Exercise per Session: 10 min  Stress: No Stress Concern Present (01/04/2023)   [employer] of Occupational Health - Occupational Stress Questionnaire    Feeling of Stress : Not at all  Social Connections: Socially Integrated (01/04/2023)   Social Connection and Isolation Panel [NHANES]    Frequency of Communication with Friends and Family: More than three times a week    Frequency of Social Gatherings with Friends and Family: Once a week    Attends Religious Services: 1 to 4 times per year    Active Member of Clubs or Organizations: Yes    Attends Banker Meetings: 1 to 4 times per year    Marital Status: Married     No Known Allergies  Immunization History  Administered Date(s) Administered   Influenza,inj,Quad PF,6+ Mos 02/14/2019, 12/01/2020   PFIZER(Purple Top)SARS-COV-2 Vaccination 02/13/2019, 03/06/2019   Tdap 08/19/2018, 07/29/2020    Health Maintenance  Topic Date Due   COVID-19 Vaccine (3 - Pfizer risk series) 04/03/2019   INFLUENZA VACCINE  08/31/2022   Cervical Cancer Screening (HPV/Pap Cotest)  11/15/2027   DTaP/Tdap/Td (3 - Td or Tdap) 07/30/2030   Hepatitis C Screening  Completed   HIV Screening  Completed   HPV VACCINES  Aged Out    Patient Care Team: Ronnald Ramp, MD as PCP - General (Family Medicine) Hildred Laser, MD as Referring Physician (Obstetrics and Gynecology)  Review of Systems  Last CBC Lab Results  Component Value Date   WBC 4.7 06/09/2022   HGB 12.3 06/09/2022   HCT 36.7 06/09/2022   MCV 86.4 06/09/2022   MCH 28.9 06/09/2022   RDW 14.1 06/09/2022   PLT 295 06/09/2022   Last metabolic panel Lab Results   Component Value Date   GLUCOSE 76 06/09/2022   NA 135 06/09/2022   K 3.6 06/09/2022   CL 103 06/09/2022   CO2 22 06/09/2022   BUN 6 06/09/2022   CREATININE 0.75 06/09/2022   GFRNONAA >60 06/09/2022   CALCIUM 9.5 06/09/2022   PROT 8.0 06/09/2022   ALBUMIN 4.2 06/09/2022   LABGLOB 2.7 08/21/2019   AGRATIO 1.7 08/21/2019   BILITOT 0.7 06/09/2022   ALKPHOS 57 06/09/2022   AST 14 (L) 06/09/2022   ALT 12 06/09/2022   ANIONGAP 10 06/09/2022   Last lipids Lab Results  Component Value Date   CHOL 158 11/15/2022   HDL 49 11/15/2022   LDLCALC 95 11/15/2022   TRIG 69 11/15/2022   CHOLHDL 3.2 11/15/2022   Last hemoglobin A1c Lab Results  Component Value Date   HGBA1C  4.9 11/15/2022   Last thyroid functions Lab Results  Component Value Date   TSH 1.550 11/15/2022        Objective    BP (!) 100/58   Pulse 76   Resp (!) 97   Ht 5\' 7"  (1.702 m)   Wt 139 lb 6.4 oz (63.2 kg)   LMP 04/21/2022   BMI 21.83 kg/m    BP Readings from Last 3 Encounters:  01/08/23 (!) 100/58  11/15/22 109/83  06/27/22 104/72   Wt Readings from Last 3 Encounters:  01/08/23 139 lb 6.4 oz (63.2 kg)  11/15/22 143 lb 8 oz (65.1 kg)  06/27/22 140 lb 11.2 oz (63.8 kg)        Depression Screen    01/08/2023    8:13 AM 11/15/2022    8:25 AM 04/12/2021   10:41 AM 12/01/2020    4:28 PM  PHQ 2/9 Scores  PHQ - 2 Score 0 0 2 0  PHQ- 9 Score 3  6 1    No results found for any visits on 01/08/23.   Physical Exam Vitals reviewed.  Constitutional:      General: She is not in acute distress.    Appearance: Normal appearance. She is not ill-appearing, toxic-appearing or diaphoretic.  Eyes:     Conjunctiva/sclera: Conjunctivae normal.  Cardiovascular:     Rate and Rhythm: Normal rate and regular rhythm.     Pulses: Normal pulses.     Heart sounds: Normal heart sounds. No murmur heard.    No friction rub. No gallop.  Pulmonary:     Effort: Pulmonary effort is normal. No respiratory  distress.     Breath sounds: Normal breath sounds. No stridor. No wheezing, rhonchi or rales.  Abdominal:     General: Bowel sounds are normal. There is no distension or abdominal bruit. There are no signs of injury.     Palpations: Abdomen is soft. There is no shifting dullness, fluid wave, hepatomegaly, mass or pulsatile mass.     Tenderness: There is no abdominal tenderness. There is no guarding or rebound. Negative signs include Murphy's sign and Rovsing's sign.  Musculoskeletal:     Right lower leg: No edema.     Left lower leg: No edema.  Skin:    Findings: No erythema or rash.  Neurological:     Mental Status: She is alert and oriented to person, place, and time.       Assessment & Plan      Problem List Items Addressed This Visit       Other   Healthcare maintenance    Currently pregnant and taking prenatal vitamins. No significant past medical history except for a previous C-section. Discussed the importance of annual physical exams, vaccinations, and screenings, including flu vaccination, mammograms, and colonoscopies as per guidelines. - Schedule annual physical exams - Recommend flu vaccine - Continue prenatal vitamins      Abdominal discomfort - Primary    Chronic digestive issues characterized by bloating, discomfort, and diarrhea-predominant symptoms for the past few months. Normal metabolic panel, cholesterol, CBC, A1c, and thyroid levels. Differential diagnosis includes food sensitivities or IBS. Discussed the impact of pregnancy on gastrointestinal symptoms and dietary adjustments. Explained that IBS can be managed with dietary changes and medications are not preferred during pregnancy. Discussed the FODMAP diet, fiber intake, hydration, and keeping a symptom and food diary. - Recommend FODMAP diet - Advise keeping a symptom and food diary - Increase fiber intake - Ensure adequate hydration -  Consider referral to GI specialist if symptoms persist or worsen       Other Visit Diagnoses     Loose stools             Return if symptoms worsen or fail to improve.      Ronnald Ramp, MD  [facility] [phone] (phone) [phone] (fax)  [facility]

## 2023-01-10 DIAGNOSIS — F411 Generalized anxiety disorder: Secondary | ICD-10-CM | POA: Diagnosis not present

## 2023-01-11 ENCOUNTER — Encounter: Payer: Self-pay | Admitting: Obstetrics and Gynecology

## 2023-01-11 ENCOUNTER — Telehealth: Payer: BC Managed Care – PPO

## 2023-01-11 VITALS — Wt 139.0 lb

## 2023-01-11 DIAGNOSIS — Z369 Encounter for antenatal screening, unspecified: Secondary | ICD-10-CM

## 2023-01-11 DIAGNOSIS — Z3689 Encounter for other specified antenatal screening: Secondary | ICD-10-CM

## 2023-01-11 DIAGNOSIS — Z3480 Encounter for supervision of other normal pregnancy, unspecified trimester: Secondary | ICD-10-CM | POA: Insufficient documentation

## 2023-01-11 NOTE — Patient Instructions (Signed)
First Trimester of Pregnancy  The first trimester of pregnancy starts on the first day of your last menstrual period until the end of week 12. This is also called months 1 through 3 of pregnancy. Body changes during your first trimester Your body goes through many changes during pregnancy. The changes usually return to normal after your baby is born. Physical changes You may gain or lose weight. Your breasts may grow larger and hurt. The area around your nipples may get darker. Dark spots or blotches may develop on your face. You may have changes in your hair. Health changes You may feel like you might vomit (nauseous), and you may vomit. You may have heartburn. You may have headaches. You may have trouble pooping (constipation). Your gums may bleed. Other changes You may get tired easily. You may pee (urinate) more often. Your menstrual periods will stop. You may not feel hungry. You may want to eat certain kinds of food. You may have changes in your emotions from day to day. You may have more dreams. Follow these instructions at home: Medicines Take over-the-counter and prescription medicines only as told by your doctor. Some medicines are not safe during pregnancy. Take a prenatal vitamin that contains at least 600 micrograms (mcg) of folic acid. Eating and drinking Eat healthy meals that include: Fresh fruits and vegetables. Whole grains. Good sources of protein, such as meat, eggs, or tofu. Low-fat dairy products. Avoid raw meat and unpasteurized juice, milk, and cheese. If you feel like you may vomit, or you vomit: Eat 4 or 5 small meals a day instead of 3 large meals. Try eating a few soda crackers. Drink liquids between meals instead of during meals. You may need to take these actions to prevent or treat trouble pooping: Drink enough fluids to keep your pee (urine) pale yellow. Eat foods that are high in fiber. These include beans, whole grains, and fresh fruits and  vegetables. Limit foods that are high in fat and sugar. These include fried or sweet foods. Activity Exercise only as told by your doctor. Most people can do their usual exercise routine during pregnancy. Stop exercising if you have cramps or pain in your lower belly (abdomen) or low back. Do not exercise if it is too hot or too humid, or if you are in a place of great height (high altitude). Avoid heavy lifting. If you choose to, you may have sex unless your doctor tells you not to. Relieving pain and discomfort Wear a good support bra if your breasts are sore. Rest with your legs raised (elevated) if you have leg cramps or low back pain. If you have bulging veins (varicose veins) in your legs: Wear support hose as told by your doctor. Raise your feet for 15 minutes, 3-4 times a day. Limit salt in your food. Safety Wear your seat belt at all times when you are in a car. Talk with your doctor if someone is hurting you or yelling at you. Talk with your doctor if you are feeling sad or have thoughts of hurting yourself. Lifestyle Do not use hot tubs, steam rooms, or saunas. Do not douche. Do not use tampons or scented sanitary pads. Do not use herbal medicines, illegal drugs, or medicines that are not approved by your doctor. Do not drink alcohol. Do not smoke or use any products that contain nicotine or tobacco. If you need help quitting, ask your doctor. Avoid cat litter boxes and soil that is used by cats. These carry   germs that can cause harm to the baby and can cause a loss of your baby by miscarriage or stillbirth. General instructions Keep all follow-up visits. This is important. Ask for help if you need counseling or if you need help with nutrition. Your doctor can give you advice or tell you where to go for help. Visit your dentist. At home, brush your teeth with a soft toothbrush. Floss gently. Write down your questions. Take them to your prenatal visits. Where to find more  information American Pregnancy Association: americanpregnancy.org American College of Obstetricians and Gynecologists: www.acog.org Office on Women's Health: womenshealth.gov/pregnancy Contact a doctor if: You are dizzy. You have a fever. You have mild cramps or pressure in your lower belly. You have a nagging pain in your belly area. You continue to feel like you may vomit, you vomit, or you have watery poop (diarrhea) for 24 hours or longer. You have a bad-smelling fluid coming from your vagina. You have pain when you pee. You are exposed to a disease that spreads from person to person, such as chickenpox, measles, Zika virus, HIV, or hepatitis. Get help right away if: You have spotting or bleeding from your vagina. You have very bad belly cramping or pain. You have shortness of breath or chest pain. You have any kind of injury, such as from a fall or a car crash. You have new or increased pain, swelling, or redness in an arm or leg. Summary The first trimester of pregnancy starts on the first day of your last menstrual period until the end of week 12 (months 1 through 3). Eat 4 or 5 small meals a day instead of 3 large meals. Do not smoke or use any products that contain nicotine or tobacco. If you need help quitting, ask your doctor. Keep all follow-up visits. This information is not intended to replace advice given to you by your health care provider. Make sure you discuss any questions you have with your health care provider. Document Revised: 06/25/2019 Document Reviewed: 05/01/2019 Elsevier Patient Education  2024 Elsevier Inc. Commonly Asked Questions During Pregnancy  Cats: A parasite can be excreted in cat feces.  To avoid exposure you need to have another person empty the little box.  If you must empty the litter box you will need to wear gloves.  Wash your hands after handling your cat.  This parasite can also be found in raw or undercooked meat so this should also be  avoided.  Colds, Sore Throats, Flu: Please check your medication sheet to see what you can take for symptoms.  If your symptoms are unrelieved by these medications please call the office.  Dental Work: Most any dental work your dentist recommends is permitted.  X-rays should only be taken during the first trimester if absolutely necessary.  Your abdomen should be shielded with a lead apron during all x-rays.  Please notify your provider prior to receiving any x-rays.  Novocaine is fine; gas is not recommended.  If your dentist requires a note from us prior to dental work please call the office and we will provide one for you.  Exercise: Exercise is an important part of staying healthy during your pregnancy.  You may continue most exercises you were accustomed to prior to pregnancy.  Later in your pregnancy you will most likely notice you have difficulty with activities requiring balance like riding a bicycle.  It is important that you listen to your body and avoid activities that put you at a higher   risk of falling.  Adequate rest and staying well hydrated are a must!  If you have questions about the safety of specific activities ask your provider.    Exposure to Children with illness: Try to avoid obvious exposure; report any symptoms to us when noted,  If you have chicken pos, red measles or mumps, you should be immune to these diseases.   Please do not take any vaccines while pregnant unless you have checked with your OB provider.  Fetal Movement: After 28 weeks we recommend you do "kick counts" twice daily.  Lie or sit down in a calm quiet environment and count your baby movements "kicks".  You should feel your baby at least 10 times per hour.  If you have not felt 10 kicks within the first hour get up, walk around and have something sweet to eat or drink then repeat for an additional hour.  If count remains less than 10 per hour notify your provider.  Fumigating: Follow your pest control agent's  advice as to how long to stay out of your home.  Ventilate the area well before re-entering.  Hemorrhoids:   Most over-the-counter preparations can be used during pregnancy.  Check your medication to see what is safe to use.  It is important to use a stool softener or fiber in your diet and to drink lots of liquids.  If hemorrhoids seem to be getting worse please call the office.   Hot Tubs:  Hot tubs Jacuzzis and saunas are not recommended while pregnant.  These increase your internal body temperature and should be avoided.  Intercourse:  Sexual intercourse is safe during pregnancy as long as you are comfortable, unless otherwise advised by your provider.  Spotting may occur after intercourse; report any bright red bleeding that is heavier than spotting.  Labor:  If you know that you are in labor, please go to the hospital.  If you are unsure, please call the office and let us help you decide what to do.  Lifting, straining, etc:  If your job requires heavy lifting or straining please check with your provider for any limitations.  Generally, you should not lift items heavier than that you can lift simply with your hands and arms (no back muscles)  Painting:  Paint fumes do not harm your pregnancy, but may make you ill and should be avoided if possible.  Latex or water based paints have less odor than oils.  Use adequate ventilation while painting.  Permanents & Hair Color:  Chemicals in hair dyes are not recommended as they cause increase hair dryness which can increase hair loss during pregnancy.  " Highlighting" and permanents are allowed.  Dye may be absorbed differently and permanents may not hold as well during pregnancy.  Sunbathing:  Use a sunscreen, as skin burns easily during pregnancy.  Drink plenty of fluids; avoid over heating.  Tanning Beds:  Because their possible side effects are still unknown, tanning beds are not recommended.  Ultrasound Scans:  Routine ultrasounds are performed  at approximately 20 weeks.  You will be able to see your baby's general anatomy an if you would like to know the gender this can usually be determined as well.  If it is questionable when you conceived you may also receive an ultrasound early in your pregnancy for dating purposes.  Otherwise ultrasound exams are not routinely performed unless there is a medical necessity.  Although you can request a scan we ask that you pay for it when   conducted because insurance does not cover " patient request" scans.  Work: If your pregnancy proceeds without complications you may work until your due date, unless your physician or employer advises otherwise.  Round Ligament Pain/Pelvic Discomfort:  Sharp, shooting pains not associated with bleeding are fairly common, usually occurring in the second trimester of pregnancy.  They tend to be worse when standing up or when you remain standing for long periods of time.  These are the result of pressure of certain pelvic ligaments called "round ligaments".  Rest, Tylenol and heat seem to be the most effective relief.  As the womb and fetus grow, they rise out of the pelvis and the discomfort improves.  Please notify the office if your pain seems different than that described.  It may represent a more serious condition.  Common Medications Safe in Pregnancy  Acne:      Constipation:  Benzoyl Peroxide     Colace  Clindamycin      Dulcolax Suppository  Topica Erythromycin     Fibercon  Salicylic Acid      Metamucil         Miralax AVOID:        Senakot   Accutane    Cough:  Retin-A       Cough Drops  Tetracycline      Phenergan w/ Codeine if Rx  Minocycline      Robitussin (Plain & DM)  Antibiotics:     Crabs/Lice:  Ceclor       RID  Cephalosporins    AVOID:  E-Mycins      Kwell  Keflex  Macrobid/Macrodantin   Diarrhea:  Penicillin      Kao-Pectate  Zithromax      Imodium AD         PUSH FLUIDS AVOID:       Cipro     Fever:  Tetracycline      Tylenol (Regular  or Extra  Minocycline       Strength)  Levaquin      Extra Strength-Do not          Exceed 8 tabs/24 hrs Caffeine:        <200mg/day (equiv. To 1 cup of coffee or  approx. 3 12 oz sodas)         Gas: Cold/Hayfever:       Gas-X  Benadryl      Mylicon  Claritin       Phazyme  **Claritin-D        Chlor-Trimeton    Headaches:  Dimetapp      ASA-Free Excedrin  Drixoral-Non-Drowsy     Cold Compress  Mucinex (Guaifenasin)     Tylenol (Regular or Extra  Sudafed/Sudafed-12 Hour     Strength)  **Sudafed PE Pseudoephedrine   Tylenol Cold & Sinus     Vicks Vapor Rub  Zyrtec  **AVOID if Problems With Blood Pressure         Heartburn: Avoid lying down for at least 1 hour after meals  Aciphex      Maalox     Rash:  Milk of Magnesia     Benadryl    Mylanta       1% Hydrocortisone Cream  Pepcid  Pepcid Complete   Sleep Aids:  Prevacid      Ambien   Prilosec       Benadryl  Rolaids       Chamomile Tea  Tums (Limit 4/day)     Unisom           Tylenol PM         Warm milk-add vanilla or  Hemorrhoids:       Sugar for taste  Anusol/Anusol H.C.  (RX: Analapram 2.5%)  Sugar Substitutes:  Hydrocortisone OTC     Ok in moderation  Preparation H      Tucks        Vaseline lotion applied to tissue with wiping    Herpes:     Throat:  Acyclovir      Oragel  Famvir  Valtrex     Vaccines:         Flu Shot Leg Cramps:       *Gardasil  Benadryl      Hepatitis A         Hepatitis B Nasal Spray:       Pneumovax  Saline Nasal Spray     Polio Booster         Tetanus Nausea:       Tuberculosis test or PPD  Vitamin B6 25 mg TID   AVOID:    Dramamine      *Gardasil  Emetrol       Live Poliovirus  Ginger Root 250 mg QID    MMR (measles, mumps &  High Complex Carbs @ Bedtime    rebella)  Sea Bands-Accupressure    Varicella (Chickenpox)  Unisom 1/2 tab TID     *No known complications           If received before Pain:         Known pregnancy;   Darvocet       Resume series  after  Lortab        Delivery  Percocet    Yeast:   Tramadol      Femstat  Tylenol 3      Gyne-lotrimin  Ultram       Monistat  Vicodin           MISC:         All Sunscreens           Hair Coloring/highlights          Insect Repellant's          (Including DEET)         Mystic Tans  

## 2023-01-11 NOTE — Progress Notes (Signed)
New OB Intake  I connected with  Manfred Arch on 01/11/23 at  1:15 PM EST by Video Visit and verified that I am speaking with the correct person using two identifiers. Nurse is located at [facility] and pt is located at home.  I explained I am completing New OB Intake today. We discussed her EDD of 08/26/23 that is based on LMP of 11/19/2022. Pt is G3/P1011. I reviewed her allergies, medications, Medical/Surgical/OB history, and appropriate screenings. There are no cats in the home.   Based on history, this is a/an pregnancy uncomplicated . Her obstetrical history is significant for advanced maternal age.  Patient Active Problem List   Diagnosis Date Noted   Supervision of other normal pregnancy, antepartum 01/11/2023   Abdominal discomfort 01/08/2023   Healthcare maintenance 01/08/2023   HSV infection    Uterine fibroid 07/29/2020    Concerns addressed today None  Delivery Plans:  Plans to deliver at [facility].  Anatomy US Explained first scheduled Korea will be Dec. 31st and an anatomy scan will be done at 20 weeks.  Labs Discussed genetic screening with patient. Patient may desire genetic testing to be drawn at new OB visit. Discussed possible labs to be drawn at new OB appointment.  COVID Vaccine Patient has had COVID vaccine.   Social Determinants of Health Food Insecurity: denies food insecurity Transportation: Patient denies transportation needs. Childcare: Discussed no children allowed at ultrasound appointments.   First visit review I reviewed new OB appt with pt. I explained she will have ob bloodwork and pap smear/pelvic exam if indicated. Explained pt will be seen by Carie Caddy, CNM at first visit; encounter routed to appropriate provider.   Chadsity Sowder, [place] 01/11/2023  1:41 PM

## 2023-01-18 DIAGNOSIS — F4320 Adjustment disorder, unspecified: Secondary | ICD-10-CM | POA: Diagnosis not present

## 2023-01-21 IMAGING — DX DG ABD PORTABLE 1V
2 series · 2 of 2 positions shown · non-contrast
Comparison: None.

CLINICAL DATA: Labor and delivery indication for care or
intervention.

EXAM:
PORTABLE ABDOMEN - 1 VIEW

[abdomen supine (1 of 2)]
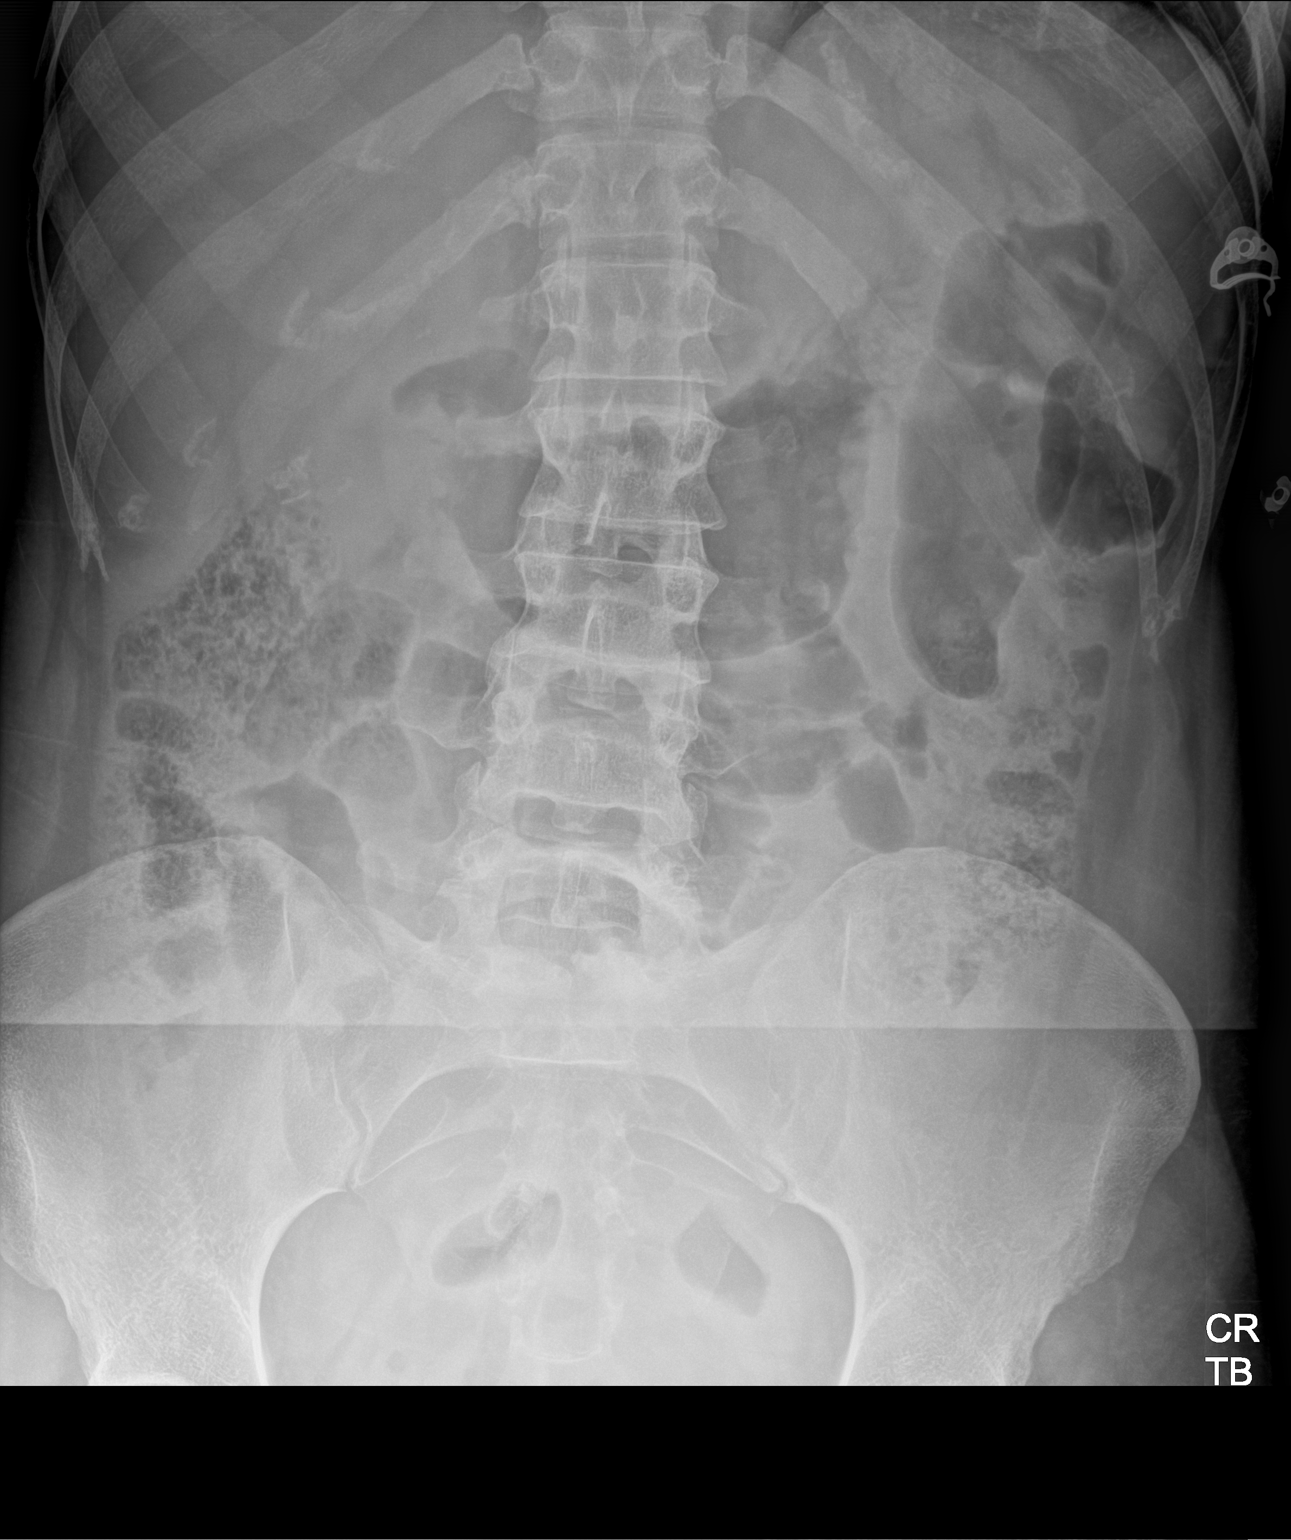

[abdomen supine (2 of 2)]
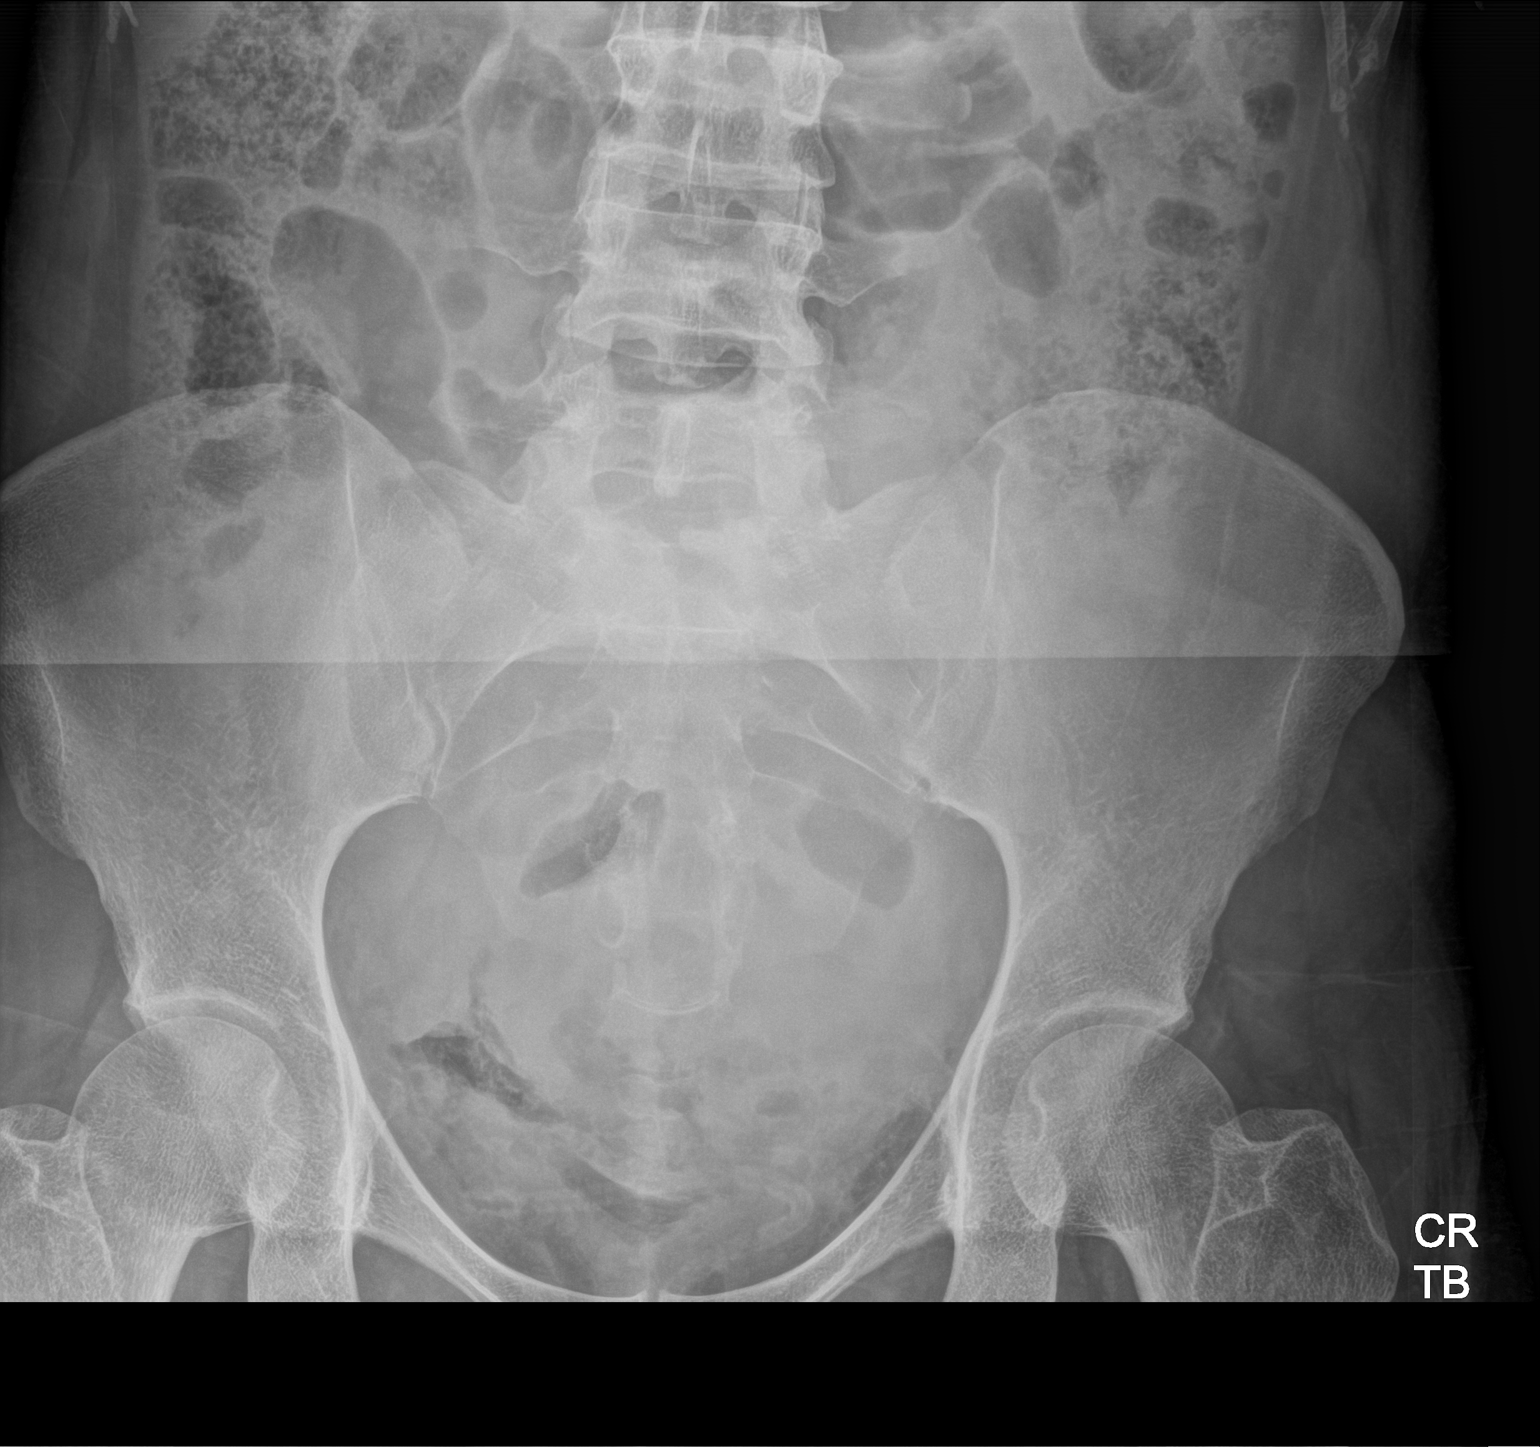

[2 of 2 positions shown; findings below may reference images not displayed]

FINDINGS: The bowel gas pattern is normal. No radio-opaque calculi or other
significant radiographic abnormality are seen.
IMPRESSION: Negative.

## 2023-01-22 DIAGNOSIS — F411 Generalized anxiety disorder: Secondary | ICD-10-CM | POA: Diagnosis not present

## 2023-01-30 ENCOUNTER — Other Ambulatory Visit: Payer: Self-pay | Admitting: Licensed Practical Nurse

## 2023-01-30 ENCOUNTER — Ambulatory Visit: Payer: BC Managed Care – PPO

## 2023-01-30 DIAGNOSIS — Z3480 Encounter for supervision of other normal pregnancy, unspecified trimester: Secondary | ICD-10-CM

## 2023-01-30 DIAGNOSIS — Z3A10 10 weeks gestation of pregnancy: Secondary | ICD-10-CM | POA: Diagnosis not present

## 2023-01-30 DIAGNOSIS — Z369 Encounter for antenatal screening, unspecified: Secondary | ICD-10-CM

## 2023-01-30 DIAGNOSIS — Z3481 Encounter for supervision of other normal pregnancy, first trimester: Secondary | ICD-10-CM | POA: Diagnosis not present

## 2023-01-31 NOTE — L&D Delivery Note (Addendum)
 Delivery Note  First Stage: Labor onset: 1430 Augmentation : Cooke catheter, pitocin , AROM Analgesia Holli intrapartum: IV fentanyl  AROM at 1856  Second Stage: Complete dilation at 2300 Onset of pushing at 2304 FHR second stage Category II, recurrent variable decelerations with quick recovery, moderate variability in between, overall reassuring  Delivery of a viable female infant 08/21/2023 at 2344 by Edsel [name], CNM. delivery of fetal head in OA position with restitution to ROA. No nuchal cord;  Anterior then posterior shoulders delivered easily with gentle downward traction. Baby placed on mom's chest, and attended to by peds.  Cord double clamped after delivery of the placenta, cut by FOB Cord blood sample collected   Third Stage: Placenta delivered Keren intact with 3 VC @ 2351 Placenta disposition: discarded Uterine tone firm / bleeding scant  Right labial, right sulcal, left vaginal, and second degree perineal laceration identified  Anesthesia for repair: lidocaine , IV fentanyl , taken to the OR Called for Dr. JONETTA. Schermerhorn to assist with repair d/t extensiveness of the repair. Initially attempted to repair bedside but decision made to repair in the OR d/t patient discomfort. See Dr. JONETTA. Schermerhorn's OR note for more details of the repair. Est. Blood Loss (mL): in the bag, on the laps, total, TXA given to control bleeding from the lacerations. Check CBC in several hours.  Complications: extensive laceration  Mom to postpartum.  Baby to Couplet care / Skin to Skin.  Newborn: Birth Weight: TBD, infant skin-to-skin  Apgar Scores: 9, 9 Feeding planned: breastfeeding

## 2023-02-12 ENCOUNTER — Encounter: Payer: Self-pay | Admitting: Licensed Practical Nurse

## 2023-02-13 ENCOUNTER — Telehealth: Payer: Self-pay

## 2023-02-13 NOTE — Telephone Encounter (Signed)
 Patient states she is [redacted] weeks pregnant and having issues with sore gums. She went to dentist yesterday. They advised her to use salt water which she states only helps for a few minutes. She is inquiring if there are any other options. Advised she can use Oragel.

## 2023-02-15 ENCOUNTER — Encounter: Payer: Self-pay | Admitting: Licensed Practical Nurse

## 2023-02-15 ENCOUNTER — Other Ambulatory Visit (HOSPITAL_COMMUNITY)
Admission: RE | Admit: 2023-02-15 | Discharge: 2023-02-15 | Disposition: A | Discharge status: Home / Self Care | Payer: BC Managed Care – PPO | Source: Ambulatory Visit | Attending: Licensed Practical Nurse | Admitting: Licensed Practical Nurse

## 2023-02-15 ENCOUNTER — Ambulatory Visit (INDEPENDENT_AMBULATORY_CARE_PROVIDER_SITE_OTHER): Payer: BC Managed Care – PPO | Admitting: Licensed Practical Nurse

## 2023-02-15 VITALS — BP 105/73 | HR 95 | Wt 135.6 lb

## 2023-02-15 DIAGNOSIS — F4320 Adjustment disorder, unspecified: Secondary | ICD-10-CM | POA: Diagnosis not present

## 2023-02-15 DIAGNOSIS — O98811 Other maternal infectious and parasitic diseases complicating pregnancy, first trimester: Secondary | ICD-10-CM | POA: Diagnosis not present

## 2023-02-15 DIAGNOSIS — O34219 Maternal care for unspecified type scar from previous cesarean delivery: Secondary | ICD-10-CM

## 2023-02-15 DIAGNOSIS — O09521 Supervision of elderly multigravida, first trimester: Secondary | ICD-10-CM | POA: Diagnosis not present

## 2023-02-15 DIAGNOSIS — G43909 Migraine, unspecified, not intractable, without status migrainosus: Secondary | ICD-10-CM | POA: Insufficient documentation

## 2023-02-15 DIAGNOSIS — G43009 Migraine without aura, not intractable, without status migrainosus: Secondary | ICD-10-CM

## 2023-02-15 DIAGNOSIS — Z1379 Encounter for other screening for genetic and chromosomal anomalies: Secondary | ICD-10-CM | POA: Diagnosis not present

## 2023-02-15 DIAGNOSIS — O99351 Diseases of the nervous system complicating pregnancy, first trimester: Secondary | ICD-10-CM

## 2023-02-15 DIAGNOSIS — Z113 Encounter for screening for infections with a predominantly sexual mode of transmission: Secondary | ICD-10-CM | POA: Insufficient documentation

## 2023-02-15 DIAGNOSIS — Z3480 Encounter for supervision of other normal pregnancy, unspecified trimester: Secondary | ICD-10-CM

## 2023-02-15 DIAGNOSIS — Z3A12 12 weeks gestation of pregnancy: Secondary | ICD-10-CM

## 2023-02-15 DIAGNOSIS — O09529 Supervision of elderly multigravida, unspecified trimester: Secondary | ICD-10-CM | POA: Insufficient documentation

## 2023-02-15 DIAGNOSIS — O99611 Diseases of the digestive system complicating pregnancy, first trimester: Secondary | ICD-10-CM

## 2023-02-15 DIAGNOSIS — B009 Herpesviral infection, unspecified: Secondary | ICD-10-CM

## 2023-02-15 DIAGNOSIS — K921 Melena: Secondary | ICD-10-CM

## 2023-02-15 DIAGNOSIS — Z98891 History of uterine scar from previous surgery: Secondary | ICD-10-CM | POA: Insufficient documentation

## 2023-02-15 HISTORY — DX: Migraine, unspecified, not intractable, without status migrainosus: G43.909

## 2023-02-15 MED ORDER — ASPIRIN 81 MG PO CHEW: 81.0000 mg | CHEWABLE_TABLET | Freq: Every day | ORAL | Status: DC

## 2023-02-15 NOTE — Assessment & Plan Note (Signed)
-  desires TOLAC, needs to see MD for consent -Desires Korea in third trimester for presentation, ok with BSUS

## 2023-02-15 NOTE — Assessment & Plan Note (Signed)
-  consider referral to GI

## 2023-02-15 NOTE — Assessment & Plan Note (Signed)
-  NIPT collected -Daily ASA

## 2023-02-15 NOTE — Progress Notes (Addendum)
NEW OB HISTORY AND PHYSICAL  SUBJECTIVE:       Jasmin Jackson is a 38 y.o. G64P1011 female, Patient's last menstrual period was 11/19/2022., Estimated Date of Delivery: 08/26/23, [redacted]w[redacted]d, presents today for establishment of Prenatal Care. This was a planned pregnancy. She had a c-section for breech which was found at 8cm, she desires an Korea in the third trimester to confirm presentation, ok with BSUS  -breastfed x 6 months, gained around 20 pounds in last pregnancy.  She reports  Migraines: -has hx of Migraines, they occur almost daily, Tylenol does not help Blood in stool -Had a BM today, she noticed her stool was dark colored and then the toilet water was colored with blood. She did not need to strain, the stool was soft and formed. She did have to strain to have a firm BM on Saturday. Is unaware of any hemorrhoids  Insomnia -has always had this issue, but is worse since being pregnant. Wakes between 1-3 and is wide awake. 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 [employer]  Exercise:walking from car to work  Substance UEA:[id number] all   Indications for ASA therapy (per uptodate) One of the following: Previous pregnancy with preeclampsia, especially early onset and with an adverse outcome No Multifetal gestation No Chronic hypertension No Type 1 or 2 diabetes mellitus No Chronic kidney disease No Autoimmune disease (antiphospholipid syndrome, systemic lupus erythematosus) No  Two or more of the following: Nulliparity No Obesity (body mass index >30 kg/m2) No Family history of preeclampsia in mother or sister No Age >=35 years Yes Sociodemographic characteristics (African American race, low socioeconomic level) Yes Personal risk factors (eg, previous pregnancy with low birth weight or small for gestational age infant, previous adverse  pregnancy outcome [eg, stillbirth], interval >10 years between pregnancies) No   Gynecologic History Patient's last menstrual period was 11/19/2022. Normal Contraception: none Last Pap: 11/15/2022. Results were: normal  Obstetric History OB History  Gravida Para Term Preterm AB Living  3 1 1  1 1   SAB IAB Ectopic Multiple Live Births   1  0 1    # Outcome Date GA Lbr Len/2nd Weight Sex Type Anes PTL Lv  3 Current           2 IAB 05/2022          1 Term 10/18/20 [redacted]w[redacted]d  6 lb 15.1 oz (3.15 kg)  CS-LTranv Spinal  LIV    Past Medical History:  Diagnosis Date   Alopecia    Anxiety    Chlamydia    COVID-19 06/05/2020   home test positive yesterday   Fibroadenoma of right breast    Headache    Migraines   Healthcare maintenance 01/08/2023   HSV infection    Migraines 02/15/2023   -recommend Magnesium, Co Enzyme Q 10, and Riboflavin      Uterine fibroid 07/29/2020    Past Surgical History:  Procedure Laterality Date   CESAREAN SECTION  10/18/2020   Procedure: CESAREAN SECTION;  Surgeon: Linzie Collin, MD;  Location: [facility] ORS;  Service: Obstetrics;;    Current Outpatient Medications on File Prior to Visit  Medication Sig Dispense Refill   Prenatal Vit-Fe Fumarate-FA (PRENATAL VITAMIN PO) Take 2 capsules by mouth daily.     No current facility-administered medications on file prior to visit.    No Known Allergies  Social History   Socioeconomic History   Marital status: Married    Spouse name: Molly Maduro   Number of children: 1   Years of education: 18   Highest education level: Master's degree (e.g., MA, MS, MEng, MEd, MSW, MBA)  Occupational History   Occupation: licensed Visual merchandiser  Tobacco Use   Smoking status: Never   Smokeless tobacco: Never  Vaping Use   Vaping status: Never Used  Substance and Sexual Activity   Alcohol use: Not Currently    Comment: Socially   Drug use: Never   Sexual activity: Yes    Partners: Male    Birth  control/protection: None  Other Topics Concern   Not on file  Social History Narrative   Not on file   Social Drivers of Health   Financial Resource Strain: Low Risk  (01/11/2023)   Overall Financial Resource Strain (CARDIA)    Difficulty of Paying Living Expenses: Not hard at all  Food Insecurity: No Food Insecurity (01/11/2023)   Hunger Vital Sign    Worried About Running Out of Food in the Last Year: Never true    Ran Out of Food in the Last Year: Never true  Transportation Needs: No Transportation Needs (01/11/2023)   PRAPARE - Administrator, Civil Service (Medical): No    Lack of Transportation (Non-Medical): No  Physical Activity: Insufficiently Active (01/04/2023)   Exercise Vital Sign    Days of Exercise per Week: 2 days    Minutes of Exercise per Session: 10 min  Stress: No Stress Concern Present (01/11/2023)   [employer] of Occupational Health - Occupational Stress Questionnaire    Feeling of Stress : Not at all  Social Connections: Moderately Integrated (01/11/2023)   Social Connection and Isolation Panel [NHANES]    Frequency of Communication with Friends and Family: More than three times a week    Frequency of Social Gatherings with Friends and Family: Once a week    Attends Religious Services: More than 4 times per year    Active Member of [employer] or Organizations: No    Attends Banker Meetings: Never    Marital Status: Married  Catering manager Violence: Not At Risk (01/11/2023)   Humiliation, Afraid, Rape, and Kick questionnaire    Fear of Current or Ex-Partner: No    Emotionally Abused: No    Physically Abused: No    Sexually Abused: No    Family History  Problem Relation Age of Onset   Liver disease Mother    Early death Mother    Heart attack Father    Healthy Brother    Diabetes Paternal Grandmother     The following portions of the patient's history were reviewed and updated as appropriate: allergies, current  medications, past OB history, past medical history, past surgical history, past family history, past social history, and problem list.  Constitutional: Denied constitutional symptoms, night sweats, recent illness, fatigue, fever, insomnia and weight loss.  Eyes: Denied eye symptoms, eye pain, photophobia, vision change and visual disturbance.  Ears/Nose/Throat/Neck: Denied ear, nose, throat or neck symptoms, hearing loss, nasal discharge, sinus congestion and sore throat.  Cardiovascular: Denied cardiovascular symptoms, arrhythmia, chest pain/pressure, edema, exercise intolerance, orthopnea and palpitations.  Respiratory: Denied pulmonary symptoms, asthma, pleuritic pain, productive sputum, cough, dyspnea and wheezing.  Gastrointestinal: Denied gastro-esophageal reflux, nausea and vomiting.  Genitourinary: Denied genitourinary symptoms including symptomatic vaginal discharge, pelvic relaxation issues, and urinary complaints.  Musculoskeletal: Denied musculoskeletal symptoms, stiffness, swelling, muscle weakness  and myalgia.  Dermatologic: Denied dermatology symptoms, rash and scar.  Neurologic: Denied neurology symptoms, dizziness, headache, neck pain and syncope.  Psychiatric: Denied psychiatric symptoms, anxiety and depression.  Endocrine: Denied endocrine symptoms including hot flashes and night sweats.     OBJECTIVE: Initial Physical Exam (New OB)  GENERAL APPEARANCE: alert, well appearing HEAD: normocephalic, atraumatic MOUTH: mucous membranes moist, pharynx normal without lesions THYROID: enlarged BREASTS: no masses noted, no significant tenderness, no palpable axillary nodes, no skin changes LUNGS: clear to auscultation, no wheezes, rales or rhonchi, symmetric air entry HEART: regular rate and rhythm, no murmurs ABDOMEN: soft, nontender, nondistended, no abnormal masses, no epigastric pain EXTREMITIES: no redness or tenderness in the calves or thighs SKIN: normal coloration and  turgor, no rashes LYMPH NODES: no adenopathy palpable NEUROLOGIC: alert, oriented, normal speech, no focal findings or movement disorder noted  PELVIC EXAM deferred   ASSESSMENT: Normal pregnancy   PLAN: Routine prenatal care. We discussed an overview of prenatal care and when to call. Reviewed diet, exercise, and weight gain recommendations in pregnancy. Discussed benefits of breastfeeding and lactation resources at [facility]. I reviewed labs and answered all questions.  1. Screening examination for STI (Primary) - NOB Panel - Cervicovaginal ancillary only  2. Genetic screening - MaterniT 21 plus Core, Blood  3. Supervision of other normal pregnancy, antepartum - NOB Panel - Culture, OB Urine - Monitor Drug Profile 14(MW) - Nicotine screen, urine - Urinalysis, Routine w reflex microscopic - Hgb Fractionation Cascade - MaterniT 21 plus Core, Blood  4. [redacted] weeks gestation of pregnancy  5. History of C-section  6. HSV infection  7. Migraine without aura and without status migrainosus, not intractable  8. Multigravida of advanced maternal age in first trimester  32. Blood in stool -consider referral  to GI    Thyroid slightly enlarged on exam, TSH not added to labs, will collect at next visit.    Ellouise Newer Tamina Cyphers, CNM

## 2023-02-15 NOTE — Assessment & Plan Note (Signed)
-  recommend Magnesium, Co Enzyme Q 10, and Riboflavin

## 2023-02-15 NOTE — Assessment & Plan Note (Signed)
-  last outbreak 1-2 years ago -Valtrex at 36 wks

## 2023-02-16 LAB — URINALYSIS, ROUTINE W REFLEX MICROSCOPIC
Bilirubin, UA: NEGATIVE
Glucose, UA: NEGATIVE
Leukocytes,UA: NEGATIVE
Nitrite, UA: NEGATIVE
RBC, UA: NEGATIVE
Specific Gravity, UA: 1.027 (ref 1.005–1.030)
Urobilinogen, Ur: 1 mg/dL (ref 0.2–1.0)
pH, UA: 6.5 (ref 5.0–7.5)

## 2023-02-16 LAB — CERVICOVAGINAL ANCILLARY ONLY
Chlamydia: NEGATIVE
Comment: NEGATIVE
Comment: NORMAL
Neisseria Gonorrhea: NEGATIVE

## 2023-02-17 LAB — NICOTINE SCREEN, URINE: Cotinine Ql Scrn, Ur: NEGATIVE ng/mL

## 2023-02-17 LAB — MONITOR DRUG PROFILE 14(MW)
Amphetamine Scrn, Ur: NEGATIVE ng/mL
BARBITURATE SCREEN URINE: NEGATIVE ng/mL
BENZODIAZEPINE SCREEN, URINE: NEGATIVE ng/mL
Buprenorphine, Urine: NEGATIVE ng/mL
CANNABINOIDS UR QL SCN: NEGATIVE ng/mL
Cocaine (Metab) Scrn, Ur: NEGATIVE ng/mL
Creatinine(Crt), U: 246.7 mg/dL (ref 20.0–300.0)
Fentanyl, Urine: NEGATIVE pg/mL
Meperidine Screen, Urine: NEGATIVE ng/mL
Methadone Screen, Urine: NEGATIVE ng/mL
OXYCODONE+OXYMORPHONE UR QL SCN: NEGATIVE ng/mL
Opiate Scrn, Ur: NEGATIVE ng/mL
Ph of Urine: 6.1 (ref 4.5–8.9)
Phencyclidine Qn, Ur: NEGATIVE ng/mL
Propoxyphene Scrn, Ur: NEGATIVE ng/mL
SPECIFIC GRAVITY: 1.018
Tramadol Screen, Urine: NEGATIVE ng/mL

## 2023-02-17 LAB — URINE CULTURE, OB REFLEX: Organism ID, Bacteria: NO GROWTH

## 2023-02-17 LAB — CULTURE, OB URINE

## 2023-02-20 LAB — HGB FRACTIONATION CASCADE
Hgb A2: 2.5 % (ref 1.8–3.2)
Hgb A: 97.5 % (ref 96.4–98.8)
Hgb F: 0 % (ref 0.0–2.0)
Hgb S: 0 %

## 2023-02-20 LAB — CBC/D/PLT+RPR+RH+ABO+RUBIGG...
Antibody Screen: NEGATIVE
Basophils Absolute: 0 10*3/uL (ref 0.0–0.2)
Basos: 0 %
EOS (ABSOLUTE): 0 10*3/uL (ref 0.0–0.4)
Eos: 0 %
HCV Ab: NONREACTIVE
HIV Screen 4th Generation wRfx: NONREACTIVE
Hematocrit: 33 % — ABNORMAL LOW (ref 34.0–46.6)
Hemoglobin: 11 g/dL — ABNORMAL LOW (ref 11.1–15.9)
Hepatitis B Surface Ag: NEGATIVE
Immature Grans (Abs): 0 10*3/uL (ref 0.0–0.1)
Immature Granulocytes: 0 %
Lymphocytes Absolute: 0.8 10*3/uL (ref 0.7–3.1)
Lymphs: 13 %
MCH: 29.1 pg (ref 26.6–33.0)
MCHC: 33.3 g/dL (ref 31.5–35.7)
MCV: 87 fL (ref 79–97)
Monocytes Absolute: 0.4 10*3/uL (ref 0.1–0.9)
Monocytes: 7 %
Neutrophils Absolute: 4.8 10*3/uL (ref 1.4–7.0)
Neutrophils: 80 %
Platelets: 296 10*3/uL (ref 150–450)
RBC: 3.78 x10E6/uL (ref 3.77–5.28)
RDW: 14.2 % (ref 11.7–15.4)
RPR Ser Ql: NONREACTIVE
Rh Factor: POSITIVE
Rubella Antibodies, IGG: 10.1 {index} (ref >0.99)
Varicella zoster IgG: REACTIVE
WBC: 6 10*3/uL (ref 3.4–10.8)

## 2023-02-20 LAB — MATERNIT 21 PLUS CORE, BLOOD
Fetal Fraction: 13
Result (T21): NEGATIVE
Trisomy 13 (Patau syndrome): NEGATIVE
Trisomy 18 (Edwards syndrome): NEGATIVE
Trisomy 21 (Down syndrome): NEGATIVE

## 2023-02-20 LAB — HCV INTERPRETATION

## 2023-02-21 DIAGNOSIS — F411 Generalized anxiety disorder: Secondary | ICD-10-CM | POA: Diagnosis not present

## 2023-03-05 DIAGNOSIS — R051 Acute cough: Secondary | ICD-10-CM | POA: Diagnosis not present

## 2023-03-05 DIAGNOSIS — R509 Fever, unspecified: Secondary | ICD-10-CM | POA: Diagnosis not present

## 2023-03-05 DIAGNOSIS — J101 Influenza due to other identified influenza virus with other respiratory manifestations: Secondary | ICD-10-CM | POA: Diagnosis not present

## 2023-03-07 DIAGNOSIS — F411 Generalized anxiety disorder: Secondary | ICD-10-CM | POA: Diagnosis not present

## 2023-03-15 ENCOUNTER — Ambulatory Visit (INDEPENDENT_AMBULATORY_CARE_PROVIDER_SITE_OTHER): Payer: BC Managed Care – PPO

## 2023-03-15 VITALS — Wt 134.0 lb

## 2023-03-15 DIAGNOSIS — Z3480 Encounter for supervision of other normal pregnancy, unspecified trimester: Secondary | ICD-10-CM

## 2023-03-15 DIAGNOSIS — F4320 Adjustment disorder, unspecified: Secondary | ICD-10-CM | POA: Diagnosis not present

## 2023-03-15 DIAGNOSIS — Z3482 Encounter for supervision of other normal pregnancy, second trimester: Secondary | ICD-10-CM | POA: Diagnosis not present

## 2023-03-15 DIAGNOSIS — B009 Herpesviral infection, unspecified: Secondary | ICD-10-CM

## 2023-03-15 DIAGNOSIS — Z3A16 16 weeks gestation of pregnancy: Secondary | ICD-10-CM

## 2023-03-15 DIAGNOSIS — O09529 Supervision of elderly multigravida, unspecified trimester: Secondary | ICD-10-CM

## 2023-03-15 NOTE — Assessment & Plan Note (Addendum)
-   Anatomy US ordered today. - Reviewed resources for encouraging sleep. Recommended magnesium supplement which she has taken prior to pregnancy. Also discussed Unisom and Tylenol PM. - TSH collected today due to enlarged thyroid noted on NOB PE.  - Reviewed red flag warning signs anticipatory guidance for upcoming prenatal care.

## 2023-03-15 NOTE — Progress Notes (Signed)
    Return Prenatal Note   Assessment/Plan   Plan  38 y.o. G3P1011 at [redacted]w[redacted]d presents for follow-up OB visit. Reviewed prenatal record including previous visit note.  Supervision of other normal pregnancy, antepartum - Anatomy US ordered today. - Reviewed resources for encouraging sleep. Recommended magnesium supplement which she has taken prior to pregnancy. Also discussed Unisom and Tylenol PM. - TSH collected today due to enlarged thyroid noted on NOB PE.  - Reviewed red flag warning signs anticipatory guidance for upcoming prenatal care.     No orders of the defined types were placed in this encounter.  Return in about 4 weeks (around 04/12/2023).   No future appointments.  For next visit:  continue with routine prenatal care     Subjective   38 y.o. G3P1011 at [redacted]w[redacted]d presents for this follow-up prenatal visit.  Patient has been having trouble with sleep. No trouble falling asleep but then wakes up in the middle of the night and is unable to fall back asleep for several hours.  Patient reports: Movement: Absent Contractions: Not present  Objective   Flow sheet Vitals: Fundal Height: 16 cm Fetal Heart Rate (bpm): 145 Total weight gain: -4 lb (-1.814 kg)  General Appearance  No acute distress, well appearing, and well nourished Pulmonary   Normal work of breathing Neurologic   Alert and oriented to person, place, and time Psychiatric   Mood and affect within normal limits  Jasmin Jackson, CNM  02/13/258:28 AM

## 2023-03-16 LAB — TSH: TSH: 1.89 u[IU]/mL (ref 0.450–4.500)

## 2023-03-21 DIAGNOSIS — F411 Generalized anxiety disorder: Secondary | ICD-10-CM | POA: Diagnosis not present

## 2023-04-03 DIAGNOSIS — Z3482 Encounter for supervision of other normal pregnancy, second trimester: Secondary | ICD-10-CM | POA: Diagnosis not present

## 2023-04-03 DIAGNOSIS — Z3689 Encounter for other specified antenatal screening: Secondary | ICD-10-CM | POA: Diagnosis not present

## 2023-04-04 DIAGNOSIS — F411 Generalized anxiety disorder: Secondary | ICD-10-CM | POA: Diagnosis not present

## 2023-04-12 ENCOUNTER — Other Ambulatory Visit: Payer: BC Managed Care – PPO

## 2023-04-12 ENCOUNTER — Encounter: Payer: BC Managed Care – PPO | Admitting: Licensed Practical Nurse

## 2023-04-12 DIAGNOSIS — Z3482 Encounter for supervision of other normal pregnancy, second trimester: Secondary | ICD-10-CM | POA: Diagnosis not present

## 2023-04-12 DIAGNOSIS — Z3689 Encounter for other specified antenatal screening: Secondary | ICD-10-CM | POA: Diagnosis not present

## 2023-05-02 DIAGNOSIS — F411 Generalized anxiety disorder: Secondary | ICD-10-CM | POA: Diagnosis not present

## 2023-05-28 DIAGNOSIS — F411 Generalized anxiety disorder: Secondary | ICD-10-CM | POA: Diagnosis not present

## 2023-06-01 DIAGNOSIS — Z23 Encounter for immunization: Secondary | ICD-10-CM | POA: Diagnosis not present

## 2023-06-01 DIAGNOSIS — Z3482 Encounter for supervision of other normal pregnancy, second trimester: Secondary | ICD-10-CM | POA: Diagnosis not present

## 2023-06-13 DIAGNOSIS — F411 Generalized anxiety disorder: Secondary | ICD-10-CM | POA: Diagnosis not present

## 2023-07-11 DIAGNOSIS — N898 Other specified noninflammatory disorders of vagina: Secondary | ICD-10-CM | POA: Diagnosis not present

## 2023-07-11 DIAGNOSIS — O26893 Other specified pregnancy related conditions, third trimester: Secondary | ICD-10-CM | POA: Diagnosis not present

## 2023-07-11 DIAGNOSIS — F411 Generalized anxiety disorder: Secondary | ICD-10-CM | POA: Diagnosis not present

## 2023-07-25 DIAGNOSIS — F411 Generalized anxiety disorder: Secondary | ICD-10-CM | POA: Diagnosis not present

## 2023-07-31 DIAGNOSIS — Z113 Encounter for screening for infections with a predominantly sexual mode of transmission: Secondary | ICD-10-CM | POA: Diagnosis not present

## 2023-07-31 DIAGNOSIS — O09523 Supervision of elderly multigravida, third trimester: Secondary | ICD-10-CM | POA: Diagnosis not present

## 2023-07-31 DIAGNOSIS — Z3483 Encounter for supervision of other normal pregnancy, third trimester: Secondary | ICD-10-CM | POA: Diagnosis not present

## 2023-07-31 DIAGNOSIS — O99013 Anemia complicating pregnancy, third trimester: Secondary | ICD-10-CM | POA: Diagnosis not present

## 2023-07-31 LAB — OB RESULTS CONSOLE GBS: GBS: NEGATIVE

## 2023-08-20 ENCOUNTER — Other Ambulatory Visit: Payer: Self-pay

## 2023-08-20 ENCOUNTER — Encounter: Payer: Self-pay | Admitting: Obstetrics and Gynecology

## 2023-08-20 ENCOUNTER — Inpatient Hospital Stay
Admission: EM | Admit: 2023-08-20 | Discharge: 2023-08-22 | DRG: 768 | Disposition: A | Discharge status: Home / Self Care | Source: Ambulatory Visit

## 2023-08-20 DIAGNOSIS — G43009 Migraine without aura, not intractable, without status migrainosus: Secondary | ICD-10-CM

## 2023-08-20 DIAGNOSIS — G43909 Migraine, unspecified, not intractable, without status migrainosus: Secondary | ICD-10-CM | POA: Diagnosis present

## 2023-08-20 DIAGNOSIS — Z8616 Personal history of COVID-19: Secondary | ICD-10-CM | POA: Diagnosis not present

## 2023-08-20 DIAGNOSIS — O3663X0 Maternal care for excessive fetal growth, third trimester, not applicable or unspecified: Principal | ICD-10-CM | POA: Diagnosis present

## 2023-08-20 DIAGNOSIS — Z412 Encounter for routine and ritual male circumcision: Secondary | ICD-10-CM | POA: Diagnosis not present

## 2023-08-20 DIAGNOSIS — Z8249 Family history of ischemic heart disease and other diseases of the circulatory system: Secondary | ICD-10-CM

## 2023-08-20 DIAGNOSIS — O09523 Supervision of elderly multigravida, third trimester: Secondary | ICD-10-CM

## 2023-08-20 DIAGNOSIS — K921 Melena: Secondary | ICD-10-CM

## 2023-08-20 DIAGNOSIS — K219 Gastro-esophageal reflux disease without esophagitis: Secondary | ICD-10-CM | POA: Diagnosis present

## 2023-08-20 DIAGNOSIS — O09529 Supervision of elderly multigravida, unspecified trimester: Secondary | ICD-10-CM

## 2023-08-20 DIAGNOSIS — A6000 Herpesviral infection of urogenital system, unspecified: Secondary | ICD-10-CM | POA: Diagnosis present

## 2023-08-20 DIAGNOSIS — O9081 Anemia of the puerperium: Secondary | ICD-10-CM | POA: Diagnosis not present

## 2023-08-20 DIAGNOSIS — Z7982 Long term (current) use of aspirin: Secondary | ICD-10-CM

## 2023-08-20 DIAGNOSIS — Z3480 Encounter for supervision of other normal pregnancy, unspecified trimester: Principal | ICD-10-CM

## 2023-08-20 DIAGNOSIS — O34219 Maternal care for unspecified type scar from previous cesarean delivery: Secondary | ICD-10-CM | POA: Diagnosis present

## 2023-08-20 DIAGNOSIS — Z833 Family history of diabetes mellitus: Secondary | ICD-10-CM | POA: Diagnosis not present

## 2023-08-20 DIAGNOSIS — B009 Herpesviral infection, unspecified: Secondary | ICD-10-CM | POA: Diagnosis present

## 2023-08-20 DIAGNOSIS — O9962 Diseases of the digestive system complicating childbirth: Secondary | ICD-10-CM | POA: Diagnosis present

## 2023-08-20 DIAGNOSIS — O99019 Anemia complicating pregnancy, unspecified trimester: Secondary | ICD-10-CM

## 2023-08-20 DIAGNOSIS — Z3A39 39 weeks gestation of pregnancy: Secondary | ICD-10-CM | POA: Diagnosis not present

## 2023-08-20 DIAGNOSIS — O9832 Other infections with a predominantly sexual mode of transmission complicating childbirth: Secondary | ICD-10-CM | POA: Diagnosis present

## 2023-08-20 DIAGNOSIS — Z98891 History of uterine scar from previous surgery: Secondary | ICD-10-CM

## 2023-08-20 DIAGNOSIS — O26893 Other specified pregnancy related conditions, third trimester: Secondary | ICD-10-CM | POA: Diagnosis not present

## 2023-08-20 DIAGNOSIS — F419 Anxiety disorder, unspecified: Secondary | ICD-10-CM | POA: Diagnosis not present

## 2023-08-20 DIAGNOSIS — D62 Acute posthemorrhagic anemia: Secondary | ICD-10-CM | POA: Diagnosis not present

## 2023-08-20 DIAGNOSIS — Z3490 Encounter for supervision of normal pregnancy, unspecified, unspecified trimester: Secondary | ICD-10-CM | POA: Diagnosis present

## 2023-08-20 DIAGNOSIS — Z2882 Immunization not carried out because of caregiver refusal: Secondary | ICD-10-CM | POA: Diagnosis not present

## 2023-08-20 DIAGNOSIS — Z2821 Immunization not carried out because of patient refusal: Secondary | ICD-10-CM | POA: Diagnosis not present

## 2023-08-20 LAB — CBC
HCT: 32.3 % — ABNORMAL LOW (ref 36.0–46.0)
Hemoglobin: 10.7 g/dL — ABNORMAL LOW (ref 12.0–15.0)
MCH: 29.2 pg (ref 26.0–34.0)
MCHC: 33.1 g/dL (ref 30.0–36.0)
MCV: 88 fL (ref 80.0–100.0)
Platelets: 153 K/uL (ref 150–400)
RBC: 3.67 MIL/uL — ABNORMAL LOW (ref 3.87–5.11)
RDW: 18.2 % — ABNORMAL HIGH (ref 11.5–15.5)
WBC: 4.5 K/uL (ref 4.0–10.5)
nRBC: 0 % (ref 0.0–0.2)

## 2023-08-20 MED ORDER — ACETAMINOPHEN 325 MG PO TABS: 650.0000 mg | ORAL_TABLET | ORAL | Status: DC | PRN

## 2023-08-20 MED ORDER — AMMONIA AROMATIC IN INHA
RESPIRATORY_TRACT | Status: AC
  Filled 2023-08-20: qty 10

## 2023-08-20 MED ORDER — LACTATED RINGERS IV SOLN: INTRAVENOUS | Status: DC

## 2023-08-20 MED ORDER — TERBUTALINE SULFATE 1 MG/ML IJ SOLN: 0.2500 mg | Freq: Once | INTRAMUSCULAR | Status: DC | PRN

## 2023-08-20 MED ORDER — MISOPROSTOL 200 MCG PO TABS
ORAL_TABLET | ORAL | Status: AC
  Filled 2023-08-20: qty 4

## 2023-08-20 MED ORDER — ONDANSETRON HCL 4 MG/2ML IJ SOLN: 4.0000 mg | Freq: Four times a day (QID) | INTRAMUSCULAR | Status: DC | PRN

## 2023-08-20 MED ORDER — LIDOCAINE HCL (PF) 1 % IJ SOLN
30.0000 mL | INTRAMUSCULAR | Status: AC | PRN
  Administered 2023-08-21: 30 mL via SUBCUTANEOUS
  Filled 2023-08-20: qty 30

## 2023-08-20 MED ORDER — SOD CITRATE-CITRIC ACID 500-334 MG/5ML PO SOLN: 30.0000 mL | ORAL | Status: DC | PRN

## 2023-08-20 MED ORDER — TRANEXAMIC ACID-NACL 1000-0.7 MG/100ML-% IV SOLN
INTRAVENOUS | Status: AC
  Administered 2023-08-21: 1000 mg
  Filled 2023-08-20: qty 100

## 2023-08-20 MED ORDER — OXYTOCIN BOLUS FROM INFUSION
333.0000 mL | Freq: Once | INTRAVENOUS | Status: AC
  Administered 2023-08-20: 333 mL via INTRAVENOUS

## 2023-08-20 MED ORDER — OXYTOCIN-SODIUM CHLORIDE 30-0.9 UT/500ML-% IV SOLN
2.5000 [IU]/h | INTRAVENOUS | Status: DC
  Administered 2023-08-21: 2.5 [IU]/h via INTRAVENOUS
  Filled 2023-08-20: qty 500

## 2023-08-20 MED ORDER — OXYTOCIN 10 UNIT/ML IJ SOLN
INTRAMUSCULAR | Status: AC
  Filled 2023-08-20: qty 2

## 2023-08-20 MED ORDER — FENTANYL CITRATE (PF) 100 MCG/2ML IJ SOLN
50.0000 ug | INTRAMUSCULAR | Status: DC | PRN
  Administered 2023-08-20: 50 ug via INTRAVENOUS
  Administered 2023-08-20 – 2023-08-21 (×2): 100 ug via INTRAVENOUS
  Administered 2023-08-21: 50 ug via INTRAVENOUS
  Filled 2023-08-20 (×4): qty 2

## 2023-08-20 MED ORDER — LACTATED RINGERS IV SOLN: 500.0000 mL | INTRAVENOUS | Status: DC | PRN

## 2023-08-20 MED ORDER — OXYTOCIN-SODIUM CHLORIDE 30-0.9 UT/500ML-% IV SOLN
1.0000 m[IU]/min | INTRAVENOUS | Status: DC
  Administered 2023-08-20: 2 m[IU]/min via INTRAVENOUS
  Filled 2023-08-20: qty 500

## 2023-08-20 NOTE — Progress Notes (Signed)
 Labor Progress Note  Jasmin Jackson is a 38 y.o. G3P1011 at [redacted]w[redacted]d by LMP admitted for induction of labor due to Elective at term.  Subjective: she is handling her contractions well, sitting on the birth ball  Objective: BP 112/74 (BP Location: Left Arm)   Pulse 73   Temp 98.3 F (36.8 C) (Oral)   Resp 18   Ht 5' 7 (1.702 m)   Wt 73.9 kg   LMP 11/19/2022   BMI 25.53 kg/m  Notable VS details: reviewed  Fetal Assessment: FHT:  FHR: 145 bpm, variability: moderate,  accelerations:  Present,  decelerations:  Absent Category/reactivity:  Category I UC:   regular, every 2-3 minutes SVE:    Dilation: 7cm  Effacement: 70%  Station:  -1  Consistency: soft  Position: posterior  Membrane status:AROM @ 1856 Amniotic color: clear  Labs: Lab Results  Component Value Date   WBC 4.5 08/20/2023   HGB 10.7 (L) 08/20/2023   HCT 32.3 (L) 08/20/2023   MCV 88.0 08/20/2023   PLT 153 08/20/2023    Assessment / Plan: 38 year old G3P1011 TOLAC at [redacted]w[redacted]d with IOL for elective at term  Labor: Good labor progress, Cooke catheter removed easily, AROM with internals placed after explaining benefits and risks, pitocin  infusing at 34mu/min, Dr. JONETTA. Schermerhorn updated on labor progress.  Preeclampsia:  BP 112/74 Fetal Wellbeing:  Category I Pain Control:  Labor support without medications I/D:  GBS negative Anticipated MOD:  NSVD  [name], CNM 08/20/2023, 7:02 PM

## 2023-08-20 NOTE — Progress Notes (Signed)
 Labor Progress Note  Jasmin Jackson is a 38 y.o. G3P1011 at [redacted]w[redacted]d by LMP admitted for induction of labor due to Elective at term.  Subjective: notified by RN that IUPC fell out, patient is requesting pain medication  Objective: BP 113/73 (BP Location: Left Arm)   Pulse 90   Temp 98.1 F (36.7 C) (Oral)   Resp 16   Ht 5' 7 (1.702 m)   Wt 73.9 kg   LMP 11/19/2022   BMI 25.53 kg/m  Notable VS details: reviewed  Fetal Assessment: FHT:  FHR: 135 bpm, variability: moderate,  accelerations:  Present,  decelerations:  Absent Category/reactivity:  Category I UC:   regular, every 2-3 minutes SVE:    Dilation: 9cm  Effacement: 90%  Station:  0  Consistency: soft  Position: middle  Membrane status:AROM @ 1856 Amniotic color: clear  Labs: Lab Results  Component Value Date   WBC 4.5 08/20/2023   HGB 10.7 (L) 08/20/2023   HCT 32.3 (L) 08/20/2023   MCV 88.0 08/20/2023   PLT 153 08/20/2023    Assessment / Plan: 38 year old G3P1011 TOLAC at [redacted]w[redacted]d with IOL for elective at term  Labor: Good labor progress. IUPC replaced since fell out d/t patient position changes. Pitocin  at 57mu/min.  Preeclampsia:  BP 113/73 Fetal Wellbeing:  Category I Pain Control:  IV pain meds, received relief from IV fentanyl  I/D:  GBS negative Anticipated MOD:  NSVD  [name], CNM 08/20/2023, 10:34 PM

## 2023-08-20 NOTE — Progress Notes (Signed)
 Labor Progress Note  Jasmin Jackson is a 38 y.o. G3P1011 at [redacted]w[redacted]d by LMP admitted for induction of labor due to Elective at term.  Subjective: she is requesting a cervical exam, reports rectal pressure with contractions  Objective: BP 113/73 (BP Location: Left Arm)   Pulse 90   Temp 98.1 F (36.7 C) (Oral)   Resp 16   Ht 5' 7 (1.702 m)   Wt 73.9 kg   LMP 11/19/2022   BMI 25.53 kg/m  Notable VS details: reviewed  Fetal Assessment: FHT:  FHR: 140 bpm, variability: moderate,  accelerations:  Present,  decelerations:  Absent Category/reactivity:  Category I UC:   regular, every 2-3 minutes, MVUs 180  SVE:    Dilation: 8.5cm  Effacement: 80%  Station:  0  Consistency: soft  Position: posterior  Membrane status:AROM @ 1856 Amniotic color: clear  Labs: Lab Results  Component Value Date   WBC 4.5 08/20/2023   HGB 10.7 (L) 08/20/2023   HCT 32.3 (L) 08/20/2023   MCV 88.0 08/20/2023   PLT 153 08/20/2023    Assessment / Plan: 38 year old G3P1011 TOLAC at [redacted]w[redacted]d with IOL for elective at term  Labor: Good labor progress. Starting to feel rectal pressure with contractions. Pitocin  at 45mu/min Preeclampsia:  BP 113/73 Fetal Wellbeing:  Category I Pain Control:  Labor support without medications I/D:  GBS negative Anticipated MOD:  NSVD  [name], CNM 08/20/2023, 9:05 PM

## 2023-08-20 NOTE — H&P (Signed)
 OB History & Physical   History of Present Illness:  Chief Complaint:   HPI:  Jasmin Jackson is a 38 y.o. G25P1011 female at [redacted]w[redacted]d dated by LMP c/w 10wk US .  She presents to L&D for scheduled IOL for elective at term.   Pregnancy Issues: 1. Previous cesarean section for breech, labored and made it to 8cm 2. Anemia 3. AMA 4. LGA baby (90th%) 5. Hx of HSV 6. Migraines 7. Anxiety   Maternal Medical History:   Past Medical History:  Diagnosis Date   Alopecia    Anxiety    Chlamydia    COVID-19 06/05/2020   home test positive yesterday   Fibroadenoma of right breast    Headache    Migraines   Healthcare maintenance 01/08/2023   HSV infection    Migraines 02/15/2023   -recommend Magnesium , Co Enzyme Q 10, and Riboflavin      Uterine fibroid 07/29/2020    Past Surgical History:  Procedure Laterality Date   CESAREAN SECTION  10/18/2020   Procedure: CESAREAN SECTION;  Surgeon: Janit Alm Agent, MD;  Location: [facility] ORS;  Service: Obstetrics;;    No Known Allergies  Prior to Admission medications   Medication Sig Start Date End Date Taking? Authorizing Provider  aspirin  81 MG chewable tablet Chew 1 tablet (81 mg total) by mouth daily. 02/15/23  Yes Dominic, Jinnie Jansky, CNM  ferrous sulfate  325 (65 FE) MG EC tablet Take 325 mg by mouth 3 (three) times daily with meals.   Yes [provider]  omeprazole (PRILOSEC) 40 MG capsule Take 40 mg by mouth daily.   Yes [provider]  Prenatal Vit-Fe Fumarate-FA (PRENATAL VITAMIN PO) Take 2 capsules by mouth daily.   Yes [provider]  valACYclovir  (VALTREX ) 500 MG tablet Take 500 mg by mouth 2 (two) times daily.   Yes [provider]    Prenatal care site: [facility]   Social History: She  reports that she has never smoked. She has never used smokeless tobacco. She reports that she does not currently use alcohol. She reports that she does not use drugs.  Family History: family  history includes Diabetes in her paternal grandmother; Early death in her mother; Healthy in her brother; Heart attack in her father; Liver disease in her mother.   Review of Systems: A full review of systems was performed and negative except as noted in the HPI.     Physical Exam:  Vital Signs: BP 122/76 (BP Location: Left Arm)   Pulse 98   Temp 98.3 F (36.8 C) (Oral)   Resp 18   Ht 5' 7 (1.702 m)   Wt 73.9 kg   LMP 11/19/2022   BMI 25.53 kg/m  General: no acute distress.  HEENT: normocephalic, atraumatic Heart: regular rate & rhythm.  No murmurs/rubs/gallops Lungs: clear to auscultation bilaterally, normal respiratory effort Abdomen: soft, gravid, non-tender;  EFW: 8lb Pelvic:   External: Normal external female genitalia  Cervix: Dilation: 2 / Effacement (%): 60 / Station: -1    Extremities: non-tender, symmetric, mild edema bilaterally.  DTRs: +2  Neurologic: Alert & oriented x 3.    Results for orders placed or performed during the hospital encounter of 08/20/23 (from the past 24 hours)  CBC     Status: Abnormal   Collection Time: 08/20/23  1:20 PM  Result Value Ref Range   WBC 4.5 4.0 - 10.5 K/uL   RBC 3.67 (L) 3.87 - 5.11 MIL/uL   Hemoglobin 10.7 (L)  12.0 - 15.0 g/dL   HCT 67.6 (L) 63.9 - 53.9 %   MCV 88.0 80.0 - 100.0 fL   MCH 29.2 26.0 - 34.0 pg   MCHC 33.1 30.0 - 36.0 g/dL   RDW 81.7 (H) 88.4 - 84.4 %   Platelets 153 150 - 400 K/uL   nRBC 0.0 0.0 - 0.2 %  Type and screen [facility]     Status: None (Preliminary result)   Collection Time: 08/20/23  1:21 PM  Result Value Ref Range   ABO/RH(D) PENDING    Antibody Screen PENDING    Sample Expiration      08/23/2023,2359 Performed at [facility], [address], [place], [place] [address]     Pertinent Results:  Prenatal Labs: Blood type/Rh O positive  Antibody screen neg  Rubella Immune  Varicella Immune  RPR NR  HBsAg Neg  HIV NR  GC neg  Chlamydia neg  Genetic  screening negative  1 hour GTT 105  3 hour GTT   GBS Negative   FHT: 145bpm, moderate variability, accelerations present, no decelerations, Category I tracing TOCO: contractions occasional SVE:  Dilation: 2 / Effacement (%): 60 / Station: -1    Cephalic by leopolds, c/w bedside US   No results found.  Assessment:  Jasmin Jackson is a 38 y.o. G58P1011 female at [redacted]w[redacted]d with TOLAC IOL for elective at term.   Plan:  1. Admit to Labor & Delivery; consents reviewed and obtained - Dr. CHARM Schermerhorn notified of admission and plan of care  2. Fetal Well being  - Fetal Tracing: Category I tracing - Group B Streptococcus ppx indicated: n/a, GBS negative - Presentation: vertex confirmed by bedside US    3. Routine OB: - Prenatal labs reviewed, as above - Rh positive - CBC, T&S, RPR on admit - Clear fluids, saline lock  4. Induction of Labor -  Contractions rare, external toco in place -  Pelvis adequate for trial of labor -  Plan for induction with pitocin , Cooke catheter, and AROM -  Plan for continuous fetal monitoring  -  Maternal pain control as desired; requesting unmedicated comfort measures - Anticipate vaginal delivery  5. Post Partum Planning: - Infant feeding: breastfeeding - Contraception: Kyleena IUD - Tdap: received AP - Flu: out of season  6. TOLAC: - Has been counseled on risks/benefits of TOLAC vs repeat cesarean section. She requests to TOLAC - No cytotec  usage for IOL - Anesthesia notified of TOLAC induction - Discussed internal monitors after SROM/AROM and she will consider it. She states she would like minimal intervention during her induction.   7. Hx of HSV: - No outbreak - has been taking Valtrex  for prophylaxis  8. LGA baby: - growth US  on 07/31/23 showed EFW 3367g (90%). She requests to still TOLAC. - EFW today 8lb - pelvis adequate for trial of labor  9. Anxiety: - no medications  [name], CNM 08/20/23 2:34 PM

## 2023-08-21 ENCOUNTER — Encounter
Admission: EM | Disposition: A | Discharge status: Home / Self Care | Payer: Self-pay | Source: Ambulatory Visit | Attending: Obstetrics

## 2023-08-21 ENCOUNTER — Inpatient Hospital Stay: Payer: Self-pay | Admitting: Anesthesiology

## 2023-08-21 ENCOUNTER — Inpatient Hospital Stay: Admitting: General Practice

## 2023-08-21 ENCOUNTER — Encounter: Payer: Self-pay | Admitting: Obstetrics and Gynecology

## 2023-08-21 ENCOUNTER — Other Ambulatory Visit: Payer: Self-pay

## 2023-08-21 DIAGNOSIS — O34219 Maternal care for unspecified type scar from previous cesarean delivery: Secondary | ICD-10-CM

## 2023-08-21 HISTORY — PX: REPAIR VAGINAL CUFF: SHX6067

## 2023-08-21 LAB — RPR: RPR Ser Ql: NONREACTIVE

## 2023-08-21 LAB — CBC
HCT: 27 % — ABNORMAL LOW (ref 36.0–46.0)
Hemoglobin: 8.8 g/dL — ABNORMAL LOW (ref 12.0–15.0)
MCH: 29.2 pg (ref 26.0–34.0)
MCHC: 32.6 g/dL (ref 30.0–36.0)
MCV: 89.7 fL (ref 80.0–100.0)
Platelets: 148 K/uL — ABNORMAL LOW (ref 150–400)
RBC: 3.01 MIL/uL — ABNORMAL LOW (ref 3.87–5.11)
RDW: 18.2 % — ABNORMAL HIGH (ref 11.5–15.5)
WBC: 10.1 K/uL (ref 4.0–10.5)
nRBC: 0 % (ref 0.0–0.2)

## 2023-08-21 LAB — BASIC METABOLIC PANEL WITH GFR
Anion gap: 10 (ref 5–15)
BUN: <5 mg/dL — ABNORMAL LOW (ref 6–20)
CO2: 20 mmol/L — ABNORMAL LOW (ref 22–32)
Calcium: 8.2 mg/dL — ABNORMAL LOW (ref 8.9–10.3)
Chloride: 105 mmol/L (ref 98–111)
Creatinine, Ser: 0.5 mg/dL (ref 0.44–1.00)
GFR, Estimated: >60 mL/min (ref >60)
Glucose, Bld: 125 mg/dL — ABNORMAL HIGH (ref 70–99)
Potassium: 3.7 mmol/L (ref 3.5–5.1)
Sodium: 135 mmol/L (ref 135–145)

## 2023-08-21 SURGERY — REPAIR, VAGINAL CUFF
Anesthesia: General

## 2023-08-21 MED ORDER — CEFAZOLIN SODIUM-DEXTROSE 2-4 GM/100ML-% IV SOLN
2.0000 g | INTRAVENOUS | Status: AC
  Administered 2023-08-21: 2 g via INTRAVENOUS

## 2023-08-21 MED ORDER — CEFAZOLIN SODIUM 1 G IJ SOLR
INTRAMUSCULAR | Status: AC
Start: 2023-08-21 — End: 2023-08-21
  Filled 2023-08-21: qty 10

## 2023-08-21 MED ORDER — MIDAZOLAM HCL 2 MG/2ML IJ SOLN
INTRAMUSCULAR | Status: DC | PRN
  Administered 2023-08-21: 2 mg via INTRAVENOUS

## 2023-08-21 MED ORDER — FENTANYL CITRATE (PF) 100 MCG/2ML IJ SOLN
INTRAMUSCULAR | Status: DC | PRN
  Administered 2023-08-21: 25 ug via INTRAVENOUS
  Administered 2023-08-21: 50 ug via INTRAVENOUS
  Administered 2023-08-21: 25 ug via INTRAVENOUS
  Administered 2023-08-21: 50 ug via INTRAVENOUS
  Administered 2023-08-21 (×2): 25 ug via INTRAVENOUS

## 2023-08-21 MED ORDER — PROPOFOL 10 MG/ML IV BOLUS
INTRAVENOUS | Status: AC
  Filled 2023-08-21: qty 20

## 2023-08-21 MED ORDER — FENTANYL CITRATE (PF) 100 MCG/2ML IJ SOLN
INTRAMUSCULAR | Status: AC
  Filled 2023-08-21: qty 2

## 2023-08-21 MED ORDER — COCONUT OIL OIL
1.0000 | TOPICAL_OIL | Status: DC | PRN
  Filled 2023-08-21: qty 7.5
  Filled 2023-08-21: qty 15

## 2023-08-21 MED ORDER — SUCCINYLCHOLINE CHLORIDE 200 MG/10ML IV SOSY
PREFILLED_SYRINGE | INTRAVENOUS | Status: AC
  Filled 2023-08-21: qty 10

## 2023-08-21 MED ORDER — OXYCODONE HCL 5 MG PO TABS: 5.0000 mg | ORAL_TABLET | ORAL | Status: DC | PRN

## 2023-08-21 MED ORDER — DIBUCAINE (PERIANAL) 1 % EX OINT
1.0000 | TOPICAL_OINTMENT | CUTANEOUS | Status: DC | PRN
  Administered 2023-08-22: 1 via RECTAL
  Filled 2023-08-21: qty 28

## 2023-08-21 MED ORDER — ACETAMINOPHEN 325 MG PO TABS
650.0000 mg | ORAL_TABLET | ORAL | Status: DC | PRN
  Administered 2023-08-21 – 2023-08-22 (×2): 650 mg via ORAL
  Filled 2023-08-21 (×2): qty 2

## 2023-08-21 MED ORDER — WITCH HAZEL-GLYCERIN EX PADS
1.0000 | MEDICATED_PAD | CUTANEOUS | Status: DC | PRN
  Administered 2023-08-22: 1 via TOPICAL
  Filled 2023-08-21 (×2): qty 100

## 2023-08-21 MED ORDER — PROPOFOL 10 MG/ML IV BOLUS
INTRAVENOUS | Status: DC | PRN
  Administered 2023-08-21: 200 mg via INTRAVENOUS

## 2023-08-21 MED ORDER — DEXAMETHASONE SODIUM PHOSPHATE 10 MG/ML IJ SOLN
INTRAMUSCULAR | Status: AC
  Filled 2023-08-21: qty 1

## 2023-08-21 MED ORDER — SUCCINYLCHOLINE CHLORIDE 200 MG/10ML IV SOSY
PREFILLED_SYRINGE | INTRAVENOUS | Status: DC | PRN
  Administered 2023-08-21: 80 mg via INTRAVENOUS

## 2023-08-21 MED ORDER — BENZOCAINE-MENTHOL 20-0.5 % EX AERO
1.0000 | INHALATION_SPRAY | CUTANEOUS | Status: DC | PRN
  Administered 2023-08-22: 1 via TOPICAL
  Filled 2023-08-21 (×2): qty 56

## 2023-08-21 MED ORDER — EPHEDRINE SULFATE-NACL 50-0.9 MG/10ML-% IV SOSY
PREFILLED_SYRINGE | INTRAVENOUS | Status: DC | PRN
  Administered 2023-08-21: 10 mg via INTRAVENOUS
  Administered 2023-08-21: 5 mg via INTRAVENOUS

## 2023-08-21 MED ORDER — ZOLPIDEM TARTRATE 5 MG PO TABS: 5.0000 mg | ORAL_TABLET | Freq: Every evening | ORAL | Status: DC | PRN

## 2023-08-21 MED ORDER — GELATIN ABSORBABLE 12-7 MM EX MISC
CUTANEOUS | Status: AC
  Filled 2023-08-21: qty 1

## 2023-08-21 MED ORDER — IBUPROFEN 600 MG PO TABS
600.0000 mg | ORAL_TABLET | Freq: Four times a day (QID) | ORAL | Status: DC
  Administered 2023-08-21 – 2023-08-22 (×7): 600 mg via ORAL
  Filled 2023-08-21 (×7): qty 1

## 2023-08-21 MED ORDER — EPHEDRINE 5 MG/ML INJ
INTRAVENOUS | Status: AC
  Filled 2023-08-21: qty 5

## 2023-08-21 MED ORDER — OXYCODONE HCL 5 MG PO TABS
10.0000 mg | ORAL_TABLET | ORAL | Status: DC | PRN
  Administered 2023-08-21: 10 mg via ORAL
  Filled 2023-08-21: qty 2

## 2023-08-21 MED ORDER — PRENATAL MULTIVITAMIN CH
1.0000 | ORAL_TABLET | Freq: Every day | ORAL | Status: DC
  Administered 2023-08-21 – 2023-08-22 (×2): 1 via ORAL
  Filled 2023-08-21 (×2): qty 1

## 2023-08-21 MED ORDER — MIDAZOLAM HCL 2 MG/2ML IJ SOLN
INTRAMUSCULAR | Status: AC
  Filled 2023-08-21: qty 2

## 2023-08-21 MED ORDER — SURGIFLO WITH THROMBIN (HEMOSTATIC MATRIX KIT) OPTIME
TOPICAL | Status: DC | PRN
  Administered 2023-08-21: 1 via TOPICAL

## 2023-08-21 MED ORDER — FENTANYL CITRATE (PF) 100 MCG/2ML IJ SOLN
100.0000 ug | Freq: Once | INTRAMUSCULAR | Status: AC
  Administered 2023-08-21: 100 ug via INTRAVENOUS

## 2023-08-21 MED ORDER — ONDANSETRON HCL 4 MG/2ML IJ SOLN
INTRAMUSCULAR | Status: DC | PRN
Start: 2023-08-21 — End: 2023-08-21
  Administered 2023-08-21: 4 mg via INTRAVENOUS

## 2023-08-21 MED ORDER — LIDOCAINE HCL (PF) 2 % IJ SOLN
INTRAMUSCULAR | Status: AC
  Filled 2023-08-21: qty 5

## 2023-08-21 MED ORDER — IRON SUCROSE 300 MG IVPB - SIMPLE MED
300.0000 mg | Freq: Once | Status: AC
  Administered 2023-08-21: 300 mg via INTRAVENOUS
  Filled 2023-08-21: qty 300

## 2023-08-21 MED ORDER — SIMETHICONE 80 MG PO CHEW: 80.0000 mg | CHEWABLE_TABLET | ORAL | Status: DC | PRN

## 2023-08-21 MED ORDER — ACETAMINOPHEN 10 MG/ML IV SOLN
INTRAVENOUS | Status: AC
Start: 2023-08-21 — End: 2023-08-21
  Filled 2023-08-21: qty 100

## 2023-08-21 MED ORDER — DIPHENHYDRAMINE HCL 25 MG PO CAPS: 25.0000 mg | ORAL_CAPSULE | Freq: Four times a day (QID) | ORAL | Status: DC | PRN

## 2023-08-21 MED ORDER — DOCUSATE SODIUM 100 MG PO CAPS
100.0000 mg | ORAL_CAPSULE | Freq: Every day | ORAL | Status: DC
  Administered 2023-08-21 – 2023-08-22 (×2): 100 mg via ORAL
  Filled 2023-08-21 (×2): qty 1

## 2023-08-21 MED ORDER — ONDANSETRON HCL 4 MG PO TABS: 4.0000 mg | ORAL_TABLET | ORAL | Status: DC | PRN

## 2023-08-21 MED ORDER — LIDOCAINE HCL (CARDIAC) PF 100 MG/5ML IV SOSY
PREFILLED_SYRINGE | INTRAVENOUS | Status: DC | PRN
  Administered 2023-08-21: 80 mg via INTRAVENOUS

## 2023-08-21 MED ORDER — FENTANYL CITRATE (PF) 100 MCG/2ML IJ SOLN
25.0000 ug | INTRAMUSCULAR | Status: DC | PRN
  Administered 2023-08-21: 50 ug via INTRAVENOUS

## 2023-08-21 MED ORDER — LIDOCAINE HCL (PF) 1 % IJ SOLN
INTRAMUSCULAR | Status: AC
  Filled 2023-08-21: qty 30

## 2023-08-21 MED ORDER — ONDANSETRON HCL 4 MG/2ML IJ SOLN
INTRAMUSCULAR | Status: AC
  Filled 2023-08-21: qty 2

## 2023-08-21 MED ORDER — PHENYLEPHRINE 80 MCG/ML (10ML) SYRINGE FOR IV PUSH (FOR BLOOD PRESSURE SUPPORT)
PREFILLED_SYRINGE | INTRAVENOUS | Status: DC | PRN
  Administered 2023-08-21 (×2): 80 ug via INTRAVENOUS
  Administered 2023-08-21: 160 ug via INTRAVENOUS
  Administered 2023-08-21 (×3): 80 ug via INTRAVENOUS

## 2023-08-21 MED ORDER — DEXAMETHASONE SODIUM PHOSPHATE 10 MG/ML IJ SOLN
INTRAMUSCULAR | Status: DC | PRN
  Administered 2023-08-21: 10 mg via INTRAVENOUS

## 2023-08-21 MED ORDER — ACETAMINOPHEN 10 MG/ML IV SOLN
INTRAVENOUS | Status: DC | PRN
  Administered 2023-08-21: 1000 mg via INTRAVENOUS

## 2023-08-21 MED ORDER — OXYCODONE HCL 5 MG PO TABS: 5.0000 mg | ORAL_TABLET | Freq: Once | ORAL | Status: AC | PRN

## 2023-08-21 MED ORDER — SENNOSIDES-DOCUSATE SODIUM 8.6-50 MG PO TABS: 2.0000 | ORAL_TABLET | Freq: Every day | ORAL | Status: DC

## 2023-08-21 MED ORDER — OXYCODONE HCL 5 MG/5ML PO SOLN
ORAL | Status: AC
  Filled 2023-08-21: qty 5

## 2023-08-21 MED ORDER — PHENYLEPHRINE 80 MCG/ML (10ML) SYRINGE FOR IV PUSH (FOR BLOOD PRESSURE SUPPORT)
PREFILLED_SYRINGE | INTRAVENOUS | Status: AC
  Filled 2023-08-21: qty 10

## 2023-08-21 MED ORDER — SENNOSIDES-DOCUSATE SODIUM 8.6-50 MG PO TABS
1.0000 | ORAL_TABLET | Freq: Two times a day (BID) | ORAL | Status: DC
  Administered 2023-08-21 – 2023-08-22 (×3): 1 via ORAL
  Filled 2023-08-21 (×3): qty 1

## 2023-08-21 MED ORDER — ONDANSETRON HCL 4 MG/2ML IJ SOLN: 4.0000 mg | INTRAMUSCULAR | Status: DC | PRN

## 2023-08-21 MED ORDER — ACETAMINOPHEN 500 MG PO TABS: 1000.0000 mg | ORAL_TABLET | ORAL | Status: DC

## 2023-08-21 MED ORDER — FERROUS SULFATE 325 (65 FE) MG PO TABS
325.0000 mg | ORAL_TABLET | ORAL | Status: DC
  Administered 2023-08-22: 325 mg via ORAL
  Filled 2023-08-21: qty 1

## 2023-08-21 MED ORDER — OXYCODONE HCL 5 MG/5ML PO SOLN
5.0000 mg | Freq: Once | ORAL | Status: AC | PRN
  Administered 2023-08-21: 5 mg via ORAL

## 2023-08-21 SURGICAL SUPPLY — 22 items
COUNTER NDL MAGNETIC 40 RED (SET/KITS/TRAYS/PACK) IMPLANT
COUNTER NEEDLE MAGNETIC 40 RED (SET/KITS/TRAYS/PACK) ×1 IMPLANT
GAUZE 4X4 16PLY ~~LOC~~+RFID DBL (SPONGE) IMPLANT
GAUZE PACK 2X3YD (PACKING) IMPLANT
GLOVE SURG SYN 6.5 PF PI (GLOVE) IMPLANT
GOWN STRL REUS W/ TWL LRG LVL3 (GOWN DISPOSABLE) ×1 IMPLANT
HANDLE YANKAUER SUCT BULB TIP (MISCELLANEOUS) IMPLANT
KIT BERKELEY 1ST TRIMESTER 3/8 (MISCELLANEOUS) ×1 IMPLANT
KIT TURNOVER CYSTO (KITS) ×1 IMPLANT
MANIFOLD NEPTUNE II (INSTRUMENTS) ×1 IMPLANT
NS IRRIG 1000ML POUR BTL (IV SOLUTION) IMPLANT
PACK DNC HYST (MISCELLANEOUS) ×1 IMPLANT
PAD OB MATERNITY 11 LF (PERSONAL CARE ITEMS) ×1 IMPLANT
PAD PREP OB/GYN DISP 24X41 (PERSONAL CARE ITEMS) ×1 IMPLANT
PENCIL SMOKE EVACUATOR (MISCELLANEOUS) IMPLANT
SLEEVE SCD COMPRESS KNEE LRG (STOCKING) IMPLANT
SPONGE T-LAP 18X18 ~~LOC~~+RFID (SPONGE) IMPLANT
SURGIFLO W/THROMBIN 8M KIT (HEMOSTASIS) IMPLANT
SUT VIC AB 0 CT1 18XCR BRD 8 (SUTURE) IMPLANT
SUT VIC AB 2-0 CT1 TAPERPNT 27 (SUTURE) IMPLANT
SUT VIC AB 3-0 SH 27X BRD (SUTURE) IMPLANT
WATER STERILE IRR 500ML POUR (IV SOLUTION) ×1 IMPLANT

## 2023-08-21 NOTE — Lactation Note (Signed)
 This note was copied from a baby's chart. Lactation Consultation Note  Patient Name: Jasmin Jackson Unijb'd Date: 08/21/2023 Age:36 hours Reason for consult: Initial assessment;Term;Nipple pain/trauma   Maternal Data This is mom's 2nd baby, VBAC. Mom with multiple vaginal lacerations  which were repaired in the OR. Mom with a blood loss of 1098 ml's.  On initial assessment mom reports she is unsure if baby's upper lip latch is correct. Per mom she has a crack of her right nipple. Upon assessment mom's nipple is cracked at the base of her nipple from 8 o'clock to 12 o'clock. Mom is wearing her own cool gels.  Has patient been taught Hand Expression?: Yes Does the patient have breastfeeding experience prior to this delivery?: Yes  Feeding Mother's Current Feeding Choice: Breast Milk  Interventions Interventions: Breast feeding basics reviewed;Hand express;Education;Coconut oil Discussed with mom she may want to consider using coconut oil on the crack in her right nipple to keep a scab from forming. Mom is agreeable to try the coconut oil. LC number on white board. Mom will call when baby feeds again.  Discharge Pump: Personal;DEBP;Hands Free  Consult Status Consult Status: Follow-up Date: 08/22/23 Follow-up type: In-patient  Update provided to care nurse.  [name] 08/21/2023, 7:36 PM

## 2023-08-21 NOTE — Op Note (Addendum)
 OPERATIVE REPORT   PATIENT: Jasmin Jackson MRN:  [id number] DATE OF PROCEDURE:  08/21/2023   PREOPERATIVE DIAGNOSIS:  Extensive obstetrical vaginal lacerations and postpartum hemorrhage   POSTOPERATIVE DIAGNOSIS:  Extensive obstetrical vaginal lacerations including bilateral labial, bilateral vaginal sidewall, right sulcal, first degree perineal, periurethral, periclitoral, left vaginal, and anterior vaginal lacerations   PROCEDURE: Repair of extensive obstetrical vaginal lacerations including bilateral labial, bilateral vaginal sidewall, right sulcal, first degree perineal, periurethral, periclitoral, left vaginal, and anterior vaginal lacerations   SURGEON:  Lilliah Priego, MD   ANESTHESIA: GETA   Estimated blood loss in the OR: 150cc   Fluids: 900cc   UOP: 250cc   SPECIMEN: None   COMPLICATIONS: None   FINDINGS: Extensive obstetrical lacerations with edematous tattered and friable tissue requiring 108 minutes total of repair time in the OR. Bilateral superficial labial lacerations involving entire length of labia. Deep right vaginal sidewall laceration extending 14 cm deep into the vagina and 8 cm side with exposure of sidewall adipose tissue. Similar defect on the left measuring 8 cm deep into the vagina and 6 cm wide. Right sulcal laceration continuous with first degree perineal laceration measuring 11 cm total (8 cm deep into vaginal and 3 cm onto perineum) length x 5 cm at widest area at introitus x 3 cm deep in the vagina. Laceration extending from below clitorus to right labial laceration. Periurethral and anterior vaginal laceration extending from just inferior to urethral deep into the vagina along the anterior vaginal wall bilaterally. Left vaginal laceration extending from perineum and left labia 6 cm cephalad into the vagina and 3 cm wide.                  PROCEDURE:  Patient was taken to the OR and identified. General anesthesia was induced. She was placed in the  dorsal lithotomy position with the legs carefully placed in yellowfin stirrups. The patient was draped in normal sterile fashion. Prophylactic antibiotics (cefazolin ) were given (see MAR). A time out was conducted. Foley catheter was already in place. The two 4x4 sponges were removed from the vagina. A combination of 2-0 Vicryl and 0 Vicryl in figure of eights and horizontal mattress sutures was used to repair the deep aspects of the right sulcal and left vaginal lacerations. The right vaginal sidewall laceration was repaired with a combination of 2-0 Vicryl and 0 Vicryl in figure of eights and horizontal mattress sutures. The defect was filled with Surgiflo/thrombin  to aid in hemostasis. Once hemostasis was obtained and the right pelvic sidewall defect was closed, the vaginal mucosa was closed with running 3-0 Vicryl (this re-approximated the anterior vaginal, right sulcal, and left vaginal lacerations). The same was performed on the left vaginal sidewall laceration. The periclitoral and periurethral lacerations with repaired with running 3-0 Vicryl. The labial lacerations were repaired first with a few deep interrupted sutures of 2-0 Vicryl and then with 3-0 Vicryl in a running subcuticular fashion. The first degree perineal laceration was repaired in the same fashion. Due to the edematous nature of the tissue and continued slow oozing, vaginal packing was placed.    All instruments were removed. All sponge and instrument counts were normal. The patient was awakened and taken to the PACU in the stable condition.    Beverli LULLA Dinsmore, MD, 08/21/2023, 4:08 AM

## 2023-08-21 NOTE — Progress Notes (Signed)
 Called to bedside s/p VBAC by CNM for assistance due to extensive lacerations.  Patient with multiple and extensive vaginal lacerations with partial repair completed upon my arrival. Remaining lacerations include: first degree perineal laceration, bilateral labial lacerations, right deep vaginal sidewall laceration, and right sulcal laceration. Completed part of labial laceration repair. Foley placed to aid in visualization and repair near urethra.  Due to depth of vaginal sidewall laceration plan to proceed to OR to optimize repair (pain control and visualization). Case posted. Pre-op orders placed with cefazolin  2 g.   Discussed need to proceed to OR with patient. Patient in agreement. Discussed indication, risks, and benefits.   Beverli LULLA Dinsmore, MD 08/21/2023 1:06 AM

## 2023-08-21 NOTE — Anesthesia Procedure Notes (Signed)
 Procedure Name: Intubation Date/Time: 08/21/2023 1:54 AM  Performed by: Jaylene Nest, CRNAPre-anesthesia Checklist: Patient identified, Patient being monitored, Timeout performed, Emergency Drugs available and Suction available Patient Re-evaluated:Patient Re-evaluated prior to induction Oxygen Delivery Method: Circle system utilized Preoxygenation: Pre-oxygenation with 100% oxygen Induction Type: IV induction and Rapid sequence Laryngoscope Size: 3 and McGrath Grade View: Grade I Tube type: Oral Tube size: 6.5 mm Number of attempts: 1 Airway Equipment and Method: Stylet and Video-laryngoscopy Placement Confirmation: ETT inserted through vocal cords under direct vision, positive ETCO2 and breath sounds checked- equal and bilateral Secured at: 21 cm Tube secured with: Tape Dental Injury: Teeth and Oropharynx as per pre-operative assessment

## 2023-08-21 NOTE — Anesthesia Preprocedure Evaluation (Signed)
 Anesthesia Evaluation  Patient identified by MRN, date of birth, ID band Patient awake    Reviewed: Allergy & Precautions, NPO status , Patient's Chart, lab work & pertinent test results  History of Anesthesia Complications Negative for: history of anesthetic complications  Airway Mallampati: III  TM Distance: >3 FB Neck ROM: full    Dental  (+) Chipped   Pulmonary neg pulmonary ROS, neg shortness of breath   Pulmonary exam normal        Cardiovascular Exercise Tolerance: Good (-) hypertensionnegative cardio ROS Normal cardiovascular exam     Neuro/Psych  Headaches PSYCHIATRIC DISORDERS Anxiety        GI/Hepatic ,GERD  Medicated and Controlled,,  Endo/Other    Renal/GU      Musculoskeletal   Abdominal   Peds  Hematology negative hematology ROS (+)   Anesthesia Other Findings Past Medical History: No date: Alopecia No date: Anxiety No date: Chlamydia 06/05/2020: COVID-19     Comment:  home test positive yesterday 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 No pertinent surgical history.  BMI    Body Mass Index: 23.81 kg/m      Reproductive/Obstetrics (+) Pregnancy                              Anesthesia Physical Anesthesia Plan  ASA: 2  Anesthesia Plan:    Post-op Pain Management:    Induction: Intravenous  PONV Risk Score and Plan: 3 and Ondansetron , Dexamethasone  and Midazolam   Airway Management Planned: LMA  Additional Equipment:   Intra-op Plan:   Post-operative Plan: Extubation in OR  Informed Consent: I have reviewed the patients History and Physical, chart, labs and discussed the procedure including the risks, benefits and alternatives for the proposed anesthesia with the patient or authorized representative who has indicated his/her understanding and acceptance.      Dental Advisory Given  Plan Discussed with: Anesthesiologist, CRNA and Surgeon  Anesthesia Plan Comments: (Patient consented for risks of anesthesia including but not limited to:  - adverse reactions to medications - damage to eyes, teeth, lips or other oral mucosa - nerve damage due to positioning  - sore throat or hoarseness - Damage to heart, brain, nerves, lungs, other parts of body or loss of life  Patient voiced understanding and assent.)        Anesthesia Quick Evaluation

## 2023-08-21 NOTE — Progress Notes (Signed)
 Postpartum Day  1  Subjective: 38 y.o. H6E7987 postpartum day #1 status post normal spontaneous vaginal delivery. She is ambulating, is tolerating po, is not voiding spontaneously, foley remains intact and patent.  Her pain is not well controlled on PO pain medications. Her lochia is less than menses.  Objective: BP 97/63 (BP Location: Left Arm)   Pulse 86   Temp 98 F (36.7 C) (Oral)   Resp 16   Ht 5' 7 (1.702 m)   Wt 73.9 kg   LMP 11/19/2022   SpO2 99%   Breastfeeding Unknown   BMI 25.53 kg/m    Physical Exam:  General: alert, cooperative, and appears stated age Breasts: soft/nontender Pulm: nl effort Abdomen: soft, non-tender, active bowel sounds Uterine Fundus: firm Perineum: moderate edema with vaginal packing  DVT Evaluation: No evidence of DVT seen on physical exam. Negative Homan's sign. No cords or calf tenderness. No significant calf/ankle edema.  Recent Labs    08/20/23 1320 08/21/23 0552  HGB 10.7* 8.8*  HCT 32.3* 27.0*  WBC 4.5 10.1  PLT 153 148*    Assessment/Plan: 38 y.o. H6E7987 Postpartum Day  1  1. Continue routine postpartum care  2. Infant feeding status: breast feeding --Lactation consult PRN for breastfeeding   3. Contraception plan: IUD  4. Acute blood loss anemia - clinically significant.  --Hemodynamically stable and symptomatic --Intervention: start on oral supplementation with ferrous sulfate  325 mg  and IV iron  transfusion with venofer  given   5. Immunization status:   all immunizations up to date  6. IV Fentanyl  given prior to removal due to patient voicing inadequate control with oral pain mediation given 1 hour prior.  7. Vaginal packing- removed with Normal Saline flushes, no bleeding with removal, patient tolerated well. Patient encouraged to use derma plast spray, witch hazel pads, peri bottle and ice packs  8. Foley catheter removed  Disposition: continue inpatient postpartum care    LOS: 1 day   Janequa Kipnis, CNM 08/21/2023, 12:32 PM   ----- Bobbette Brunswick Certified Nurse Midwife [facility]

## 2023-08-21 NOTE — Lactation Note (Signed)
 This note was copied from a baby's chart. Lactation Consultation Note  Patient Name: Jasmin Jackson Unijb'd Date: 08/21/2023 Age:36 hours Reason for consult: Follow-up assessment;Mother's request;Term;Nipple pain/trauma;Breastfeeding assistance   Maternal Data This is mom's 2nd baby, VBAC. Mom with multiple vaginal lacerations which were repaired in the OR. Mom with a blood loss of 1098 ml's.   On follow-up mom requested assistance with breastfeeding. Has patient been taught Hand Expression?: Yes Does the patient have breastfeeding experience prior to this delivery?: Yes How long did the patient breastfeed?: 6 months Mom reports a difficult start to breastfeeding with her 1st baby and is hoping this time for it to go smoother.  Feeding Mother's Current Feeding Choice: Breast Milk Provided mom with tips and strategies to maximize position and latch techniques. Mom had been feeding baby at her right breast in cradle hold when her nipple cracked. Recommended mom use football hold on the right breast in order to change the angle the nipple is compressed, to achieve a deeper latch, and more easily correct the baby's upper lip latch as he has a tendency to roll his upper lip inward. Baby fed well, multiple swallows noted by mom, dad, and LC. Mom was able to tolerate feeding baby at the right breast. LATCH Score Latch: Grasps breast easily, tongue down, lips flanged, rhythmical sucking. (Assisted mom with correcting baby's upper lip latch.)  Audible Swallowing: Spontaneous and intermittent  Type of Nipple: Everted at rest and after stimulation  Comfort (Breast/Nipple): Engorged, cracked, bleeding, large blisters, severe discomfort (Mom's right nipple is cracked at the base of her nipple on the outer aspect of the nipple.)  Hold (Positioning): Assistance needed to correctly position infant at breast and maintain latch.  LATCH Score: 7   Interventions Interventions: Breast feeding basics  reviewed;Assisted with latch;Breast massage;Hand express;Breast compression;Adjust position;Support pillows;Position options;Coconut oil;Education  Discharge Discharge Education: Engorgement and breast care Pump: Personal;DEBP;Hands Free;Manual  Consult Status Consult Status: Follow-up Date: 08/22/23 Follow-up type: In-patient  Update provided to care nurse.  [name] 08/21/2023, 8:53 PM

## 2023-08-21 NOTE — Discharge Summary (Signed)
 Postpartum Discharge Summary  Patient Name: Jasmin Jackson DOB: Nov 07, 1985 MRN: [id number]  Date of admission: 08/20/2023 Delivery date:08/20/2023 Delivering provider: TANDA HOUSTON RENEE Date of discharge: 08/22/2023  Primary OB: [facility] OFE:Ejupzwu'd last menstrual period was 11/19/2022. EDC Estimated Date of Delivery: 08/26/23 Gestational Age at Delivery: 101w2d   Admitting diagnosis: Encounter for elective induction of labor [Z34.90] Intrauterine pregnancy: [redacted]w[redacted]d     Secondary diagnosis:   Principal Problem:   VBAC (vaginal birth after Cesarean) Active Problems:   HSV infection   History of C-section   Migraines   AMA (advanced maternal age) multigravida 35+   Encounter for elective induction of labor   Anemia affecting pregnancy   Anxiety during pregnancy   NSVD (normal spontaneous vaginal delivery)   Extensive obstetrical laceration   Postpartum hemorrhage   Discharge Diagnosis: Term Pregnancy Delivered and VBAC                                                Post partum procedures:blood transfusion and repair of lacerations in OR  Induction:: AROM, Pitocin , and OP Foley Complications: Hemorrhage>1040mL Delivery Type: spontaneous vaginal delivery and vaginal birth after cesarean (VBAC) Anesthesia: non-pharmacological methods, IV narcotics Placenta: spontaneous To Pathology: No  Laceration: first degree perineal laceration, bilateral labial lacerations, right deep vaginal sidewall laceration, and right sulcal laceration  Episiotomy: none  Prenatal Labs:  Blood type/Rh O positive  Antibody screen neg  Rubella Immune  Varicella Immune  RPR NR  HBsAg Neg  HIV NR  GC neg  Chlamydia neg  Genetic screening negative  1 hour GTT 105  3 hour GTT    GBS Negative    Hospital course: Induction of Labor With Vaginal Delivery   38 y.o. yo [id number] at 108w2d was admitted to the hospital 08/20/2023 for induction of labor.  Indication for induction: Elective.   Patient had an labor course without complication. She progressed quickly to 10/100/+2 and pushed , delivering viable female infant, Apgars 9/9. Extensive lacerations of first degree perineal laceration, bilateral labial lacerations, right deep vaginal sidewall laceration, and right sulcal laceration , repaired in the OR. See delivery note and OR note for more information. Membrane Rupture Time/Date: 6:56 PM,08/20/2023  Delivery Method:VBAC, Spontaneous Operative Delivery:N/A Episiotomy: None Lacerations:  Vaginal;Perineal;Labial;Periurethral;1st degree Details of delivery can be found in separate delivery note.  Patient had a postpartum course complicated by acute blood loss anemia. She received 1 unit pRBC. Patient is discharged home 08/22/23.  Newborn Data: Jasmin Jackson Birth date:08/20/2023 Birth time:11:44 PM Gender:Female Living status:Living Apgars:9 ,9  Weight:3580 g  Magnesium  Sulfate received: No BMZ received: No Rhophylac:No MMR:No Varivax vaccine given: was not indicated T-DaP:Given prenatally Flu: No  Transfusion:Yes  Physical exam  Vitals:   08/22/23 1105 08/22/23 1131 08/22/23 1400 08/22/23 1606  BP: 99/62 100/64 (!) 92/56 99/62  Pulse: 89 96 85 87  Resp: 18 18 18 18   Temp: 98.1 F (36.7 C) 98.4 F (36.9 C) 98.4 F (36.9 C) 98.7 F (37.1 C)  TempSrc: Oral Oral Oral Oral  SpO2: 100% 100% 100% 100%  Weight:      Height:       General: alert, cooperative, and no distress Lochia: appropriate Uterine Fundus: firm Perineum: minimal edema/repair well approximated DVT Evaluation: No evidence of DVT seen on physical exam.  Labs: Lab Results  Component Value Date   WBC 8.5  08/22/2023   HGB 8.2 (L) 08/22/2023   HCT 24.6 (L) 08/22/2023   MCV 92.8 08/22/2023   PLT 148 (L) 08/22/2023      Latest Ref Rng & Units 08/21/2023    5:52 AM  CMP  Glucose 70 - 99 mg/dL 874   BUN 6 - 20 mg/dL <5   Creatinine 9.55 - 1.00 mg/dL 9.49   Sodium 864 - 854 mmol/L 135    Potassium 3.5 - 5.1 mmol/L 3.7   Chloride 98 - 111 mmol/L 105   CO2 22 - 32 mmol/L 20   Calcium 8.9 - 10.3 mg/dL 8.2    Edinburgh Score:    08/21/2023    9:15 PM  Edinburgh Postnatal Depression Scale Screening Tool  I have been able to laugh and see the funny side of things. 0  I have looked forward with enjoyment to things. 0  I have blamed myself unnecessarily when things went wrong. 0  I have been anxious or worried for no good reason. 1  I have felt scared or panicky for no good reason. 0  Things have been getting on top of me. 0  I have been so unhappy that I have had difficulty sleeping. 0  I have felt sad or miserable. 0  I have been so unhappy that I have been crying. 0  The thought of harming myself has occurred to me. 0  Edinburgh Postnatal Depression Scale Total 1    Risk assessment for postpartum VTE and prophylactic treatment: Very high risk factors: None High risk factors: None Moderate risk factors: None  Postpartum VTE prophylaxis with LMWH not indicated  After visit meds:  Allergies as of 08/22/2023   No Known Allergies      Medication List     STOP taking these medications    aspirin  81 MG chewable tablet       TAKE these medications    acetaminophen  500 MG tablet Commonly known as: TYLENOL  Take 2 tablets (1,000 mg total) by mouth every 6 (six) hours as needed for mild pain (pain score 1-3), fever or headache (for pain scale < 4).   benzocaine -Menthol  20-0.5 % Aero Commonly known as: DERMOPLAST Apply 1 Application topically as needed for irritation (perineal discomfort).   dibucaine 1 % Oint Commonly known as: NUPERCAINAL Place 1 Application rectally as needed for hemorrhoids.   docusate sodium  100 MG capsule Commonly known as: COLACE Take 1 capsule (100 mg total) by mouth daily. Start taking on: August 23, 2023   ferrous sulfate  325 (65 FE) MG EC tablet Take 1 tablet (325 mg total) by mouth every other day. What changed: when to take  this   ibuprofen  600 MG tablet Commonly known as: ADVIL  Take 1 tablet (600 mg total) by mouth every 6 (six) hours as needed for mild pain (pain score 1-3), moderate pain (pain score 4-6) or cramping.   omeprazole 40 MG capsule Commonly known as: PRILOSEC Take 40 mg by mouth daily.   PRENATAL VITAMIN PO Take 2 capsules by mouth daily.   senna-docusate 8.6-50 MG tablet Commonly known as: Senokot-S Take 1 tablet by mouth daily as needed for mild constipation or moderate constipation.   valACYclovir  500 MG tablet Commonly known as: VALTREX  Take 500 mg by mouth 2 (two) times daily.   witch hazel-glycerin  pad Commonly known as: TUCKS Apply 1 Application topically as needed for hemorrhoids.       Discharge home in stable condition Infant Feeding: Breast Infant Disposition:home with mother Discharge  instruction: per After Visit Summary and Postpartum booklet. Activity: Advance as tolerated. Pelvic rest for 6 weeks.  Diet: iron  rich diet Anticipated Birth Control: Kyleena IUD Postpartum Appointment:6 weeks Additional Postpartum F/U: laceration check in 2 weeks Future Appointments:No future appointments. Follow up Visit:  Follow-up Information     Tanda Edsel Fuller, CNM Follow up in 2 week(s).   Specialty: Certified Nurse Midwife Why: laceration check Contact information: [address] [place] [address] [phone]         Tanda Edsel Fuller, CNM Follow up in 6 week(s).   Specialty: Certified Nurse Midwife Why: 6wk postpartum Contact information: [address] [place] [address] [phone]                 Plan:  Eupha Lobb was discharged to home in good condition. Follow-up appointment as directed.    Signed:  Therisa CHRISTELLA Pillow, CNM 08/22/2023 6:51 PM  Therisa Pillow, CNM Certified Nurse Midwife [facility]

## 2023-08-21 NOTE — Transfer of Care (Signed)
 Immediate Anesthesia Transfer of Care Note  Patient: Jasmin Jackson  Procedure(s) Performed: Repair of bilateral labial lacerations, bilateral vaginal sidewall lacerations, right sulcal laceration, first degree perineal laceration, periurethral laceration, periclitoral laceration, anterior vaginal laceration  Patient Location: PACU  Anesthesia Type:General  Level of Consciousness: drowsy  Airway & Oxygen Therapy: Patient Spontanous Breathing and Patient connected to face mask oxygen  Post-op Assessment: Report given to RN and Post -op Vital signs reviewed and stable  Post vital signs: Reviewed and stable  Last Vitals:  Vitals Value Taken Time  BP 100/61 08/21/23 04:09  Temp 36.3 C 08/21/23 04:09  Pulse 89 08/21/23 04:14  Resp 13 08/21/23 04:14  SpO2 100 % 08/21/23 04:14  Vitals shown include unfiled device data.  Last Pain:  Vitals:   08/21/23 0024  TempSrc:   PainSc: 7          Complications: No notable events documented.

## 2023-08-22 ENCOUNTER — Encounter: Payer: Self-pay | Admitting: Obstetrics and Gynecology

## 2023-08-22 LAB — HEMOGLOBIN AND HEMATOCRIT, BLOOD
HCT: 24.6 % — ABNORMAL LOW (ref 36.0–46.0)
Hemoglobin: 8.2 g/dL — ABNORMAL LOW (ref 12.0–15.0)

## 2023-08-22 LAB — PREPARE RBC (CROSSMATCH)

## 2023-08-22 LAB — CBC
HCT: 23.1 % — ABNORMAL LOW (ref 36.0–46.0)
Hemoglobin: 7.3 g/dL — ABNORMAL LOW (ref 12.0–15.0)
MCH: 29.3 pg (ref 26.0–34.0)
MCHC: 31.6 g/dL (ref 30.0–36.0)
MCV: 92.8 fL (ref 80.0–100.0)
Platelets: 148 K/uL — ABNORMAL LOW (ref 150–400)
RBC: 2.49 MIL/uL — ABNORMAL LOW (ref 3.87–5.11)
RDW: 18.7 % — ABNORMAL HIGH (ref 11.5–15.5)
WBC: 8.5 K/uL (ref 4.0–10.5)
nRBC: 0.5 % — ABNORMAL HIGH (ref 0.0–0.2)

## 2023-08-22 MED ORDER — DOCUSATE SODIUM 100 MG PO CAPS: 100.0000 mg | ORAL_CAPSULE | Freq: Every day | ORAL | Status: DC

## 2023-08-22 MED ORDER — IBUPROFEN 600 MG PO TABS: 600.0000 mg | ORAL_TABLET | Freq: Four times a day (QID) | ORAL | 0 refills | Status: DC | PRN

## 2023-08-22 MED ORDER — FERROUS SULFATE 325 (65 FE) MG PO TBEC: 325.0000 mg | DELAYED_RELEASE_TABLET | ORAL | Status: AC

## 2023-08-22 MED ORDER — ACETAMINOPHEN 500 MG PO TABS: 1000.0000 mg | ORAL_TABLET | Freq: Four times a day (QID) | ORAL | Status: DC | PRN

## 2023-08-22 MED ORDER — SODIUM CHLORIDE 0.9% IV SOLUTION
Freq: Once | INTRAVENOUS | Status: AC
  Administered 2023-08-22: 10 mL via INTRAVENOUS

## 2023-08-22 MED ORDER — WITCH HAZEL-GLYCERIN EX PADS: 1.0000 | MEDICATED_PAD | CUTANEOUS | Status: DC | PRN

## 2023-08-22 MED ORDER — DIBUCAINE (PERIANAL) 1 % EX OINT: 1.0000 | TOPICAL_OINTMENT | CUTANEOUS | Status: DC | PRN

## 2023-08-22 MED ORDER — BENZOCAINE-MENTHOL 20-0.5 % EX AERO: 1.0000 | INHALATION_SPRAY | CUTANEOUS | Status: DC | PRN

## 2023-08-22 MED ORDER — SENNOSIDES-DOCUSATE SODIUM 8.6-50 MG PO TABS: 1.0000 | ORAL_TABLET | Freq: Every day | ORAL | Status: DC | PRN

## 2023-08-22 NOTE — Anesthesia Postprocedure Evaluation (Signed)
 Anesthesia Post Note  Patient: Camrie Stock  Procedure(s) Performed: Repair of bilateral labial lacerations, bilateral vaginal sidewall lacerations, right sulcal laceration, first degree perineal laceration, periurethral laceration, periclitoral laceration, anterior vaginal laceration  Patient location during evaluation: PACU Anesthesia Type: General Level of consciousness: awake and alert Pain management: pain level controlled Vital Signs Assessment: post-procedure vital signs reviewed and stable Respiratory status: spontaneous breathing, nonlabored ventilation, respiratory function stable and patient connected to nasal cannula oxygen Cardiovascular status: blood pressure returned to baseline and stable Postop Assessment: no apparent nausea or vomiting Anesthetic complications: no   No notable events documented.   Last Vitals:  Vitals:   08/21/23 2213 08/22/23 0727  BP: (!) 91/55 94/61  Pulse: 90 90  Resp: 18 18  Temp: 37.1 C 36.7 C  SpO2: 93% 100%    Last Pain:  Vitals:   08/22/23 0727  TempSrc: Oral  PainSc:                  Debby Mines

## 2023-08-22 NOTE — Lactation Note (Signed)
 This note was copied from a baby's chart. Lactation Consultation Note  Patient Name: Jasmin Jackson Date: 08/22/2023 Age:36 hours Reason for consult: Follow-up assessment;Mother's request;Term;Nipple pain/trauma;Breastfeeding assistance   Maternal Data This is mom's 2nd baby, VBAC. Mom with multiple vaginal lacerations which were repaired in the OR. Mom with a blood loss of 1098 ml's.    On follow-up mom requested assistance with breastfeeding. Has patient been taught Hand Expression?: Yes Does the patient have breastfeeding experience prior to this delivery?: Yes How long did the patient breastfeed?: 6 months  Feeding Mother's Current Feeding Choice: Breast Milk Baby latched and breastfed well with swallows noted by mom and LC. Mom's left nipple is tender but intact. Mom using cool gels brought from home. Mom is applying small amount of coconut oil to cracked right nipple. LATCH Score Latch: Grasps breast easily, tongue down, lips flanged, rhythmical sucking.  Audible Swallowing: Spontaneous and intermittent  Type of Nipple: Everted at rest and after stimulation  Comfort (Breast/Nipple): Engorged, cracked, bleeding, large blisters, severe discomfort  Hold (Positioning): Assistance needed to correctly position infant at breast and maintain latch.  LATCH Score: 7    Interventions Interventions: Breast feeding basics reviewed;Assisted with latch;Breast compression;Adjust position;Support pillows;Position options;Coconut oil;Education  Discharge Pump: Personal;DEBP;Hands Free;Manual  Consult Status Consult Status: Follow-up Date: 08/22/23 Follow-up type: In-patient  Update provided to care nurse.  [name] 08/22/2023, 12:07 AM

## 2023-08-22 NOTE — Progress Notes (Signed)
 Postpartum Day  2  Subjective: 38 y.o. H6E7987 postpartum day #2 status post vaginal birth after cesarean section followed by repair of extensive obstetrical vaginal lacerations in OR. She is ambulating, is tolerating po, is voiding spontaneously.  Her pain is well controlled on PO pain medications. Her lochia is less than menses. Foley catheter and vaginal packing was removed yesterday. She reports intermittent episodes of vertigo when ambulating and increased fatigue. States she feels exhausted when she gets back from the bathroom.   Objective: BP 94/61 (BP Location: Left Arm)   Pulse 90   Temp 98.1 F (36.7 C) (Oral)   Resp 18   Ht 5' 7 (1.702 m)   Wt 73.9 kg   LMP 11/19/2022   SpO2 100%   Breastfeeding Unknown   BMI 25.53 kg/m    Physical Exam:  General: alert, cooperative, and no distress Breasts: soft/nontender Pulm: nl effort Abdomen: soft, non-tender, active bowel sounds Uterine Fundus: firm Perineum: minimal edema, repair well approximated Lochia: appropriate DVT Evaluation: No evidence of DVT seen on physical exam.     Latest Ref Rng & Units 08/22/2023    8:22 AM 08/21/2023    5:52 AM 08/20/2023    1:20 PM  CBC  WBC 4.0 - 10.5 K/uL 8.5  10.1  4.5   Hemoglobin 12.0 - 15.0 g/dL 7.3  8.8  89.2   Hematocrit 36.0 - 46.0 % 23.1  27.0  32.3   Platelets 150 - 400 K/uL 148  148  153      Assessment/Plan: 38 y.o. H6E7987 postpartum day # 2  1. Continue routine postpartum care  2. Infant feeding status: breast feeding -Lactation consult PRN for breastfeeding   3. Contraception plan: IUD  4. Acute blood loss anemia - clinically significant.  -Hemodynamically stable but mildly symptomatic with fatigue and episodes of vertigo -Hgb decreased 10.7->8.8->7.3 -Received IV venofer  yesterday  -Intervention: recommend blood transfusion for 1 unit pRBC and continue to monitor H&H. We discussed risk/benefits of blood transfusion including risk of transfusion reaction and small  risk of exposure to infections like HCV and HIV. These risk are generally small and benefits of blood transfusion outweigh the risks.  -Dannon consents to blood transfusion.  -1 unit pRBC ordered. Plan for repeat H&H 4 hours post transfusion.   5. Immunization status:   all immunizations up to date   Disposition: continue inpatient postpartum care    LOS: 2 days   Therisa CHRISTELLA Pillow, EDDY 08/22/2023, 10:11 AM   ----- Therisa Pillow  Certified Nurse Midwife [facility]

## 2023-08-22 NOTE — Transfer of Care (Deleted)
 Immediate Anesthesia Transfer of Care Note  Patient: Jasmin Jackson  Procedure(s) Performed: REPAIR, VAGINAL CUFF  Patient Location: PACU  Anesthesia Type:General  Level of Consciousness: awake, alert , and oriented  Airway & Oxygen Therapy: Patient Spontanous Breathing  Post-op Assessment: Report given to RN  Post vital signs: Reviewed and stable  Last Vitals:  Vitals Value Taken Time  BP    Temp    Pulse    Resp    SpO2      Last Pain:  Vitals:   08/22/23 0727  TempSrc: Oral  PainSc:       Patients Stated Pain Goal: 0 (08/21/23 0714)  Complications: No notable events documented.

## 2023-08-22 NOTE — Plan of Care (Signed)
 Patient is tolerating regular diet, voiding, vaginal bleeding WNL, 1 unit pRBC given this shift, patient reports feeling better, no dizziness, fatigue, or increased heart rate at this time. Breast feeding infant well. Elyn Sharps, RN 08/22/23 @1830 

## 2023-08-22 NOTE — Discharge Summary (Signed)
 Pt dc instructions and follow up appt reviewed with pt. Pt verbalizes understanding of all dc instructions. SL removed. Pt up ambulating in room. No c/o dizziness. Pt to be escorted out via w/c with infant in arms for dc when ready. All dc criteria met.

## 2023-08-22 NOTE — Discharge Instructions (Signed)

## 2023-08-22 NOTE — Lactation Note (Signed)
 This note was copied from a baby's chart. Lactation Consultation Note  Patient Name: Jasmin Jackson Unijb'd Date: 08/22/2023 Age:36 hours Reason for consult: Follow-up assessment;Mother's request;Nipple pain/trauma;Term;Breastfeeding assistance   Maternal Data This is mom's 2nd baby, VBAC. Mom with multiple vaginal lacerations which were repaired in the OR. Mom with a blood loss of 1098 ml's.    On follow-up mom requested assistance with breastfeeding and measuring for correct breast shield size.Mom's right nipple with healing nipple crack, improved since yesterday. Mom is applying coconut oil she has brought her own cool gel pads. When measuring for correct breast shield size the paper measurer touched mom's cracked nipple and mom reports a stinging feeling. Mom applied coconut oil which eased her discomfort. Discussed with mom her right nipple while healing will be sensitive. Mom is questioning if she will be able to tolerate latching the baby to her right breast. Discussed with mom option to pump if she is too uncomfortable to latch the baby to her right breast. Mom has LC phone number and will call LC if she would like to initiate pumping. Has patient been taught Hand Expression?: Yes Does the patient have breastfeeding experience prior to this delivery?: Yes How long did the patient breastfeed?: 6 months  Feeding Mother's Current Feeding Choice: Breast Milk  LATCH Score Latch: Grasps breast easily, tongue down, lips flanged, rhythmical sucking.  Audible Swallowing: Spontaneous and intermittent  Type of Nipple: Everted at rest and after stimulation  Comfort (Breast/Nipple): Engorged, cracked, bleeding, large blisters, severe discomfort  Hold (Positioning): No assistance needed to correctly position infant at breast.  LATCH Score: 8   Interventions Interventions: Breast feeding basics reviewed;Breast massage;Support pillows;Education;Coconut oil  Discharge Discharge  Education: Engorgement and breast care;Warning signs for feeding baby;Outpatient recommendation Pump: Personal;DEBP;Hands Free;Manual  Consult Status Consult Status: PRN Date: 08/22/23 Follow-up type: In-patient  Update provided to care nurse  [name] 08/22/2023, 6:06 PM

## 2023-08-23 LAB — TYPE AND SCREEN
ABO/RH(D): O POS
Antibody Screen: NEGATIVE
Unit division: 0

## 2023-08-23 LAB — BPAM RBC
Blood Product Expiration Date: 202508252359
ISSUE DATE / TIME: 202507231107
Unit Type and Rh: 5100

## 2023-10-04 DIAGNOSIS — Z3202 Encounter for pregnancy test, result negative: Secondary | ICD-10-CM | POA: Diagnosis not present

## 2023-10-04 DIAGNOSIS — N898 Other specified noninflammatory disorders of vagina: Secondary | ICD-10-CM | POA: Diagnosis not present

## 2023-10-04 DIAGNOSIS — D649 Anemia, unspecified: Secondary | ICD-10-CM | POA: Diagnosis not present

## 2023-10-19 ENCOUNTER — Other Ambulatory Visit (HOSPITAL_COMMUNITY)
Admission: RE | Admit: 2023-10-19 | Discharge: 2023-10-19 | Disposition: A | Discharge status: Home / Self Care | Source: Other Acute Inpatient Hospital | Attending: Obstetrics and Gynecology | Admitting: Obstetrics and Gynecology

## 2023-10-19 ENCOUNTER — Ambulatory Visit (INDEPENDENT_AMBULATORY_CARE_PROVIDER_SITE_OTHER): Admitting: Obstetrics and Gynecology

## 2023-10-19 ENCOUNTER — Encounter: Payer: Self-pay | Admitting: Obstetrics and Gynecology

## 2023-10-19 VITALS — BP 136/87 | HR 67 | Ht 66.7 in | Wt 141.6 lb

## 2023-10-19 DIAGNOSIS — N898 Other specified noninflammatory disorders of vagina: Secondary | ICD-10-CM

## 2023-10-19 DIAGNOSIS — R319 Hematuria, unspecified: Secondary | ICD-10-CM

## 2023-10-19 DIAGNOSIS — O9089 Other complications of the puerperium, not elsewhere classified: Secondary | ICD-10-CM

## 2023-10-19 DIAGNOSIS — R82998 Other abnormal findings in urine: Secondary | ICD-10-CM | POA: Insufficient documentation

## 2023-10-19 DIAGNOSIS — R350 Frequency of micturition: Secondary | ICD-10-CM | POA: Diagnosis not present

## 2023-10-19 DIAGNOSIS — Z3A00 Weeks of gestation of pregnancy not specified: Secondary | ICD-10-CM

## 2023-10-19 LAB — POCT URINALYSIS DIP (CLINITEK)
Bilirubin, UA: NEGATIVE
Glucose, UA: NEGATIVE mg/dL
Ketones, POC UA: NEGATIVE mg/dL
Nitrite, UA: NEGATIVE
POC PROTEIN,UA: NEGATIVE
Spec Grav, UA: 1.02 (ref 1.010–1.025)
Urobilinogen, UA: 0.2 U/dL
pH, UA: 6.5 (ref 5.0–8.0)

## 2023-10-19 LAB — URINALYSIS, ROUTINE W REFLEX MICROSCOPIC
Bilirubin Urine: NEGATIVE
Glucose, UA: NEGATIVE mg/dL
Hgb urine dipstick: NEGATIVE
Ketones, ur: NEGATIVE mg/dL
Nitrite: NEGATIVE
Protein, ur: NEGATIVE mg/dL
Specific Gravity, Urine: 1.014 (ref 1.005–1.030)
pH: 6 (ref 5.0–8.0)

## 2023-10-19 MED ORDER — LIDOCAINE-PRILOCAINE 2.5-2.5 % EX CREA: 1.0000 | TOPICAL_CREAM | CUTANEOUS | 1 refills | Status: AC | PRN

## 2023-10-19 NOTE — Assessment & Plan Note (Addendum)
-   This is likely the cause of her pain. Treated today with silver nitrate.  - She should continue using vaginal estrogen cream nightly. Advised to apply with finger internally and on the vulva.  - Provided with emla  cream to use as needed for the burning - Will reexamine in a  month

## 2023-10-19 NOTE — Assessment & Plan Note (Signed)
-   Has widened GH and split labia on the right which appears more cosmetic at this time but can be repaired if desired.  - Will refer to pelvic PT in [place] to help with pelvic pressure and discomfort.

## 2023-10-19 NOTE — Progress Notes (Signed)
 New Patient Evaluation and Consultation  Referring Provider: Tanda Edsel Charlies DEWAINE PCP: Sharma Coyer, MD Date of Service: 10/19/2023  SUBJECTIVE Chief Complaint: New Patient (Initial Visit) (Jasmin Jackson is a 38 y.o. female I here for OAB)  History of Present Illness: Jasmin Jackson is a 38 y.o. Black or African-American female seen in consultation at the request of CNM Edsel Tanda for evaluation of post-delivery laceration.    Review of records significant for: Had NSVD on 08/21/23. Delivery was complicated by extensive lacerations (bilateral labial, bilateral vaginal sidewall, right sulcal, first degree perineal, periurethral, periclitoral, left vaginal, and anterior vaginal lacerations) requiring repair in the OR and a postpartum hemorrhage. She received 1 u pRBC.   Urinary Symptoms: Does not leak urine.   Day time voids 5-7.  Nocturia: 1-2 times per night to void. Voiding dysfunction:  empties bladder well.  Patient does not use a catheter to empty bladder.  When urinating, patient feels the need to urinate multiple times in a row  UTIs: 0 UTI's in the last year.   Denies history of blood in urine and kidney or bladder stones   Pelvic Organ Prolapse Symptoms:                  Patient Denies a feeling of a bulge the vaginal area.   Bowel Symptom: Bowel movements: 1 time(s) per day Stool consistency: hard Straining: yes.  Splinting: no.  Incomplete evacuation: no.  Patient Denies accidental bowel leakage / fecal incontinence Bowel regimen: none  Sexual Function Sexually active: no.  Sexual orientation: heterosexual Pain with sex: No  Pelvic Pain Admits to pelvic pain Pain occurs: with walking and standing Prior pain treatment: none Improved by: nothing  Since her delivery, has been in pain. The right side labia has pain with walking.  Soreness in general with walking and standing Has a lot of pressure in the pelvis, a  heaviness.  Has been vaginal estrogen for about a week. She is using it every night and uses the inserter. Not using anything else for vaginal moisture. She is breastfeeding.   First delivery was c-section and she did not have any issues.  She was referred to pelvic PT, does not have an appointment. She did see a pelvic evaluation in [facility] but they did not take insurance.   Past Medical History:  Past Medical History:  Diagnosis Date   Alopecia    Anxiety    Chlamydia    COVID-19 06/05/2020   home test positive yesterday   Fibroadenoma of right breast    Headache    Migraines   Healthcare maintenance 01/08/2023   HSV infection    Migraines 02/15/2023   -recommend Magnesium , Co Enzyme Q 10, and Riboflavin      Uterine fibroid 07/29/2020     Past Surgical History:   Past Surgical History:  Procedure Laterality Date   CESAREAN SECTION  10/18/2020   Procedure: CESAREAN SECTION;  Surgeon: Janit Alm Agent, MD;  Location: [facility] ORS;  Service: Obstetrics;;   REPAIR VAGINAL CUFF N/A 08/21/2023   Procedure: Repair of bilateral labial lacerations, bilateral vaginal sidewall lacerations, right sulcal laceration, first degree perineal laceration, periurethral laceration, periclitoral laceration, anterior vaginal laceration;  Surgeon: Schermerhorn, Beverli GAILS, MD;  Location: [facility] ORS;  Service: Gynecology;  Laterality: N/A;     Past OB/GYN History: OB History  Gravida Para Term Preterm AB Living  3 2 2  1 2   SAB IAB Ectopic Multiple Live Births   1  0 2    # Outcome Date GA Lbr Len/2nd Weight Sex Type Anes PTL Lv  3 Term 08/20/23 [redacted]w[redacted]d 08:30 / 00:44 7 lb 14.3 oz (3.58 kg) M VBAC Local  LIV  2 IAB 05/2022          1 Term 10/18/20 [redacted]w[redacted]d  6 lb 15.1 oz (3.15 kg)  CS-LTranv Spinal  LIV    Contraception: none.     Component Value Date/Time   DIAGPAP  11/15/2022 0836    - Negative for intraepithelial lesion or malignancy (NILM)   HPVHIGH Negative 11/15/2022 0836   ADEQPAP   11/15/2022 0836    Satisfactory for evaluation; transformation zone component PRESENT.    Medications: Patient has a current medication list which includes the following prescription(s): lidocaine -prilocaine , acetaminophen , docusate sodium , ferrous sulfate , ibuprofen , and prenatal vit-fe fumarate-fa.   Allergies: Patient has no known allergies.   Social History:  Social History   Tobacco Use   Smoking status: Never   Smokeless tobacco: Never  Vaping Use   Vaping status: Never Used  Substance Use Topics   Alcohol use: Not Currently    Comment: Socially   Drug use: Never    Relationship status: married Patient lives with spouse and children.   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 Regular exercise: No History of abuse: No  Family History:   Family History  Problem Relation Age of Onset   Liver disease Mother    Early death Mother    Heart attack Father    Healthy Brother    Diabetes Paternal Grandmother      Review of Systems: Review of Systems  Constitutional:  Negative for fever, malaise/fatigue and weight loss.  Respiratory:  Negative for cough, shortness of breath and wheezing.   Cardiovascular:  Negative for chest pain, palpitations and leg swelling.  Gastrointestinal:  Negative for abdominal pain and blood in stool.  Genitourinary:  Negative for dysuria.  Musculoskeletal:  Negative for myalgias.  Skin:  Negative for rash.  Neurological:  Negative for dizziness and headaches.  Endo/Heme/Allergies:  Does not bruise/bleed easily.  Psychiatric/Behavioral:  Negative for depression. The patient is not nervous/anxious.      OBJECTIVE Physical Exam: Vitals:   10/19/23 1312  BP: 136/87  Pulse: 67  Weight: 141 lb 9.6 oz (64.2 kg)  Height: 5' 6.7 (1.694 m)    Physical Exam Vitals reviewed. Exam conducted with a chaperone present.  Constitutional:      General: She is not in acute distress. Pulmonary:     Effort: Pulmonary effort is normal.  Abdominal:      General: There is no distension.     Palpations: Abdomen is soft.     Tenderness: There is no abdominal tenderness. There is no rebound.  Musculoskeletal:        General: No swelling. Normal range of motion.  Skin:    General: Skin is warm and dry.     Findings: No rash.  Neurological:     Mental Status: She is alert and oriented to person, place, and time.  Psychiatric:        Mood and Affect: Mood normal.        Behavior: Behavior normal.      GU / Detailed Urogynecologic Evaluation:  Pelvic Exam: right labia minora split,  Bartholin's and Skene's glands normal in appearance; urethral meatus normal in appearance, no urethral masses or discharge.   Q-tip- no allodynia over labia majora, allodynia at right labia minora in area of  split CST: negative  Reflexes: bulbocavernosis present, anocutaneous present bilaterally.  Speculum exam reveals granulation tissue on the right sidewall from the introitus about 3cm in length, treated with silver nitrate. Small area of granulation tissue at the introitus in the midline treated with silver nitrate. Cervix normal appearance. Uterus normal single, nontender. Adnexa no mass, fullness, tenderness.     Pelvic floor strength II/V  Pelvic floor musculature: Right levator non-tender, Right obturator non-tender, Left levator non-tender, Left obturator non-tender  POP-Q:   POP-Q  -2                                            Aa   -2                                           Ba  -4                                              C   5                                            Gh  3                                            Pb  8                                            tvl   -1                                            Ap  -1                                            Bp  -8                                              D      Rectal Exam:  deferred  Post-Void Residual (PVR) by Bladder Scan: In order to evaluate  bladder emptying, we discussed obtaining a postvoid residual and patient agreed to this procedure.  Procedure: The ultrasound unit was placed on the patient's abdomen in the suprapubic region after the patient had voided.    Post Void Residual - 10/19/23 1338       Post Void Residual   Post Void Residual 0 mL           Laboratory Results: Lab  Results  Component Value Date   COLORU yellow 10/19/2023   CLARITYU clear 10/19/2023   GLUCOSEUR negative 10/19/2023   BILIRUBINUR negative 10/19/2023   KETONESU Trace (A) 02/15/2023   SPECGRAV 1.020 10/19/2023   RBCUR trace-intact (A) 10/19/2023   PHUR 6.5 10/19/2023   PROTEINUR Trace 02/15/2023   UROBILINOGEN 0.2 10/19/2023   LEUKOCYTESUR Small (1+) (A) 10/19/2023    Lab Results  Component Value Date   CREATININE 0.50 08/21/2023   CREATININE 0.75 06/09/2022   CREATININE 0.76 08/21/2019    Lab Results  Component Value Date   HGBA1C 4.9 11/15/2022    Lab Results  Component Value Date   HGB 8.2 (L) 08/22/2023     ASSESSMENT AND PLAN Ms. Mcwhirt is a 38 y.o. with:  1. Granulation tissue of vagina   2. Postpartum perineal pain   3. Urinary frequency   4. Leukocytes in urine   5. Hematuria, unspecified type     Granulation tissue of vagina Assessment & Plan: - This is likely the cause of her pain. Treated today with silver nitrate.  - She should continue using vaginal estrogen cream nightly. Advised to apply with finger internally and on the vulva.  - Provided with emla  cream to use as needed for the burning - Will reexamine in a  month  Orders: -     Lidocaine -Prilocaine ; Apply 1 Application topically as needed.  Dispense: 30 g; Refill: 1  Postpartum perineal pain Assessment & Plan: - Has widened GH and split labia on the right which appears more cosmetic at this time but can be repaired if desired.  - Will refer to pelvic PT in [place] to help with pelvic pressure and discomfort.   Orders: -     AMB  referral to rehabilitation  Urinary frequency -     POCT URINALYSIS DIP (CLINITEK)  Leukocytes in urine -     Urine Culture; Future  Hematuria, unspecified type -     Urine Microscopic; Future  Return 1 month   Jasmin LOISE Caper, MD

## 2023-10-20 LAB — URINE CULTURE: Culture: NO GROWTH

## 2023-10-22 ENCOUNTER — Ambulatory Visit: Payer: Self-pay | Admitting: Obstetrics

## 2023-10-24 DIAGNOSIS — N898 Other specified noninflammatory disorders of vagina: Secondary | ICD-10-CM | POA: Diagnosis not present

## 2023-10-24 DIAGNOSIS — L929 Granulomatous disorder of the skin and subcutaneous tissue, unspecified: Secondary | ICD-10-CM | POA: Diagnosis not present

## 2023-10-24 DIAGNOSIS — O34219 Maternal care for unspecified type scar from previous cesarean delivery: Secondary | ICD-10-CM | POA: Diagnosis not present

## 2023-10-24 DIAGNOSIS — D649 Anemia, unspecified: Secondary | ICD-10-CM | POA: Diagnosis not present

## 2023-10-25 ENCOUNTER — Encounter: Payer: Self-pay | Admitting: Physical Therapy

## 2023-10-25 ENCOUNTER — Ambulatory Visit: Attending: Obstetrics and Gynecology | Admitting: Physical Therapy

## 2023-10-25 ENCOUNTER — Other Ambulatory Visit: Payer: Self-pay

## 2023-10-25 DIAGNOSIS — R279 Unspecified lack of coordination: Secondary | ICD-10-CM | POA: Insufficient documentation

## 2023-10-25 DIAGNOSIS — R293 Abnormal posture: Secondary | ICD-10-CM | POA: Diagnosis not present

## 2023-10-25 DIAGNOSIS — M6281 Muscle weakness (generalized): Secondary | ICD-10-CM | POA: Insufficient documentation

## 2023-10-25 NOTE — Addendum Note (Signed)
 Addended by: GRETEL CELENA BROCKS on: 10/25/2023 03:56 PM   Modules accepted: Orders

## 2023-10-25 NOTE — Therapy (Signed)
 OUTPATIENT PHYSICAL THERAPY FEMALE PELVIC EVALUATION   Patient Name: Jasmin Jackson MRN: [id number] DOB:[birth year], 38 y.o., female Today's Date: 10/25/2023  END OF SESSION:  PT End of Session - 10/25/23 1533     Visit Number 1    Number of Visits 6    Date for Recertification  12/06/23    Authorization Type [employer]    Authorization - Number of Visits 40    PT Start Time 0200    PT Stop Time 0245    PT Time Calculation (min) 45 min    Activity Tolerance Patient tolerated treatment well    Behavior During Therapy [facility] for tasks assessed/performed          Past Medical History:  Diagnosis Date   Alopecia    Anxiety    Chlamydia    COVID-19 06/05/2020   home test positive yesterday   Fibroadenoma of right breast    Headache    Migraines   Healthcare maintenance 01/08/2023   HSV infection    Migraines 02/15/2023   -recommend Magnesium , Co Enzyme Q 10, and Riboflavin      Uterine fibroid 07/29/2020   Past Surgical History:  Procedure Laterality Date   CESAREAN SECTION  10/18/2020   Procedure: CESAREAN SECTION;  Surgeon: Janit Alm Agent, MD;  Location: [facility] ORS;  Service: Obstetrics;;   REPAIR VAGINAL CUFF N/A 08/21/2023   Procedure: Repair of bilateral labial lacerations, bilateral vaginal sidewall lacerations, right sulcal laceration, first degree perineal laceration, periurethral laceration, periclitoral laceration, anterior vaginal laceration;  Surgeon: Schermerhorn, Beverli GAILS, MD;  Location: [facility] ORS;  Service: Gynecology;  Laterality: N/A;   Patient Active Problem List   Diagnosis Date Noted   Granulation tissue of vagina 10/19/2023   Postpartum perineal pain 10/19/2023   Postpartum hemorrhage 08/22/2023   NSVD (normal spontaneous vaginal delivery) 08/21/2023   VBAC (vaginal birth after Cesarean) 08/21/2023   Extensive obstetrical laceration 08/21/2023   Encounter for elective induction of labor 08/20/2023   Anemia affecting pregnancy 08/20/2023    Anxiety during pregnancy 08/20/2023   History of C-section 02/15/2023   Migraines 02/15/2023   AMA (advanced maternal age) multigravida 35+ 02/15/2023   Blood in stool 02/15/2023   Supervision of other normal pregnancy, antepartum 01/11/2023   HSV infection    Uterine fibroid 07/29/2020    PCP: Sharma Coyer, MD  REFERRING PROVIDER: Marilynne Rosaline SAILOR, MD  REFERRING DIAG: [phone] (ICD-10-CM) - Postpartum perineal pain  THERAPY DIAG:  Muscle weakness (generalized)  Unspecified lack of coordination  Abnormal posture  Rationale for Evaluation and Treatment: Rehabilitation  ONSET DATE: July 21st, 2025  SUBJECTIVE:  SUBJECTIVE STATEMENT: From eval: Patient reports to PFPT after having her second child vaginally (VBAC) in July. She had this birth with an epidural and experienced a lot of internal tearing along with labial/clitoral tearing. After this, she had to be admitted to the operating room for almost 2 hours. She also had some postpartum hemorrhaging as well. She has had vaginal pain since the birth. She saw a urogyn and learned she had some extra skin growth in the healing space - she got a silver nitrate treatment for this and it has taken her pain down from an 8/10 to 1/10. She will feel vaginal heaviness occasionally and she will also feel this with bending forward to pick up baby.  Fluid intake: currently breastfeeding, drinks 2 full 40 oz water bottles per day, body armour, a soda occasionally, hot tea every night, coffee 4 out of 7 days per week (1 cup)   FUNCTIONAL LIMITATIONS: still gets sore but her major functional limitations are much better since her pain is less   PERTINENT HISTORY:  Medications for current condition: estrogen cream, Flaygl Surgeries: C-section  2022  Other:  Sexual abuse: No  PAIN:  Are you having pain? Yes NPRS scale: 1/10 Pain location: Internal, Deep, Right, and Vaginal  Pain type: sharp Pain description: constant   Aggravating factors: movement  Relieving factors: resting   PRECAUTIONS: None  RED FLAGS: None   WEIGHT BEARING RESTRICTIONS: No  FALLS:  Has patient fallen in last 6 months? 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   ACTIVITY LEVEL : walked 1.5 miles day before yesterday with no pain, just soreness; up and about at the house   PLOF: Independent  PATIENT GOALS: to not have vaginal heaviness, to help with the pain   BOWEL MOVEMENT: Pain with bowel movement: No Type of bowel movement:Type (Bristol Stool Scale) 4, Frequency 1x/day, Strain no, and Splinting no Fully empty rectum: Yes:   Leakage: No                                                     Caused by: no Pads: Yes: 3 times for comfort levels  Fiber supplement/laxative No  URINATION: Pain with urination: No Fully empty bladder: Yes:                                  Post-void dribble: Yes  Stream: Strong Urgency: Yes  Frequency:during the day within normal limits                                                          Nocturia: Yes: 1x/night   Leakage: none Pads/briefs: No  INTERCOURSE: has not had intercourse yet - but has been technically cleared   Ability to have vaginal penetration Yes  Pain with intercourse: none Dryness: No Climax: yes Marinoff Scale: 0/3 Lubricant: no  PREGNANCY: Vaginal deliveries 1 Tearing Yes: extensive lacerations (bilateral labial, bilateral vaginal sidewall, right sulcal, first degree perineal, periurethral, periclitoral, left vaginal, and anterior vaginal lacerations) requiring repair in the OR and a  postpartum hemorrhage. She received 1 u pRBC.  Episiotomy No C-section deliveries 1 Currently pregnant No  PROLAPSE: Pressure  OBJECTIVE:  Note: Objective  measures were completed at Evaluation unless otherwise noted. PATIENT SURVEYS:  PFIQ-7: 45  COGNITION: Overall cognitive status: Within functional limits for tasks assessed    SENSATION: Light touch: Appears intact  LUMBAR SPECIAL TESTS:  Single leg stance test: Positive  FUNCTIONAL TESTS:  Single leg stance:  Rt: +   Lt: +  Sit-up test: 2/3 Squat: within normal limits with no pain Bed mobility: within normal limits   GAIT: Assistive device utilized: None Comments: mild trendelenburg gait pattern with ambulation   POSTURE: rounded shoulders  LUMBARAROM/PROM: within functional limits for all motions tested bilaterally with no pain   LOWER EXTREMITY ROM: within normal limits for all motions tested bilaterally with no pain   LOWER EXTREMITY MMT: 4/5 bilateral knees and hips grossly with no pain   PALPATION:  General: no tenderness to palpation of bilateral adductors or hip flexors   Pelvic Alignment: within normal limits   Abdominal: abdominal bracing at rest   Diastasis: Yes: mild at umbilicus  Distortion: No  Breathing: upper chest breathing, decreased lower rib excursion with inhalation  Scar tissue: Yes: C-section scar                External Perineal Exam: mild dryness present with slightly limited clitoral hood mobility secondary to periclitoral/periurethral scarring from birth. Granulation tissue from silver nitrate solution present, white/pink and healing sufficiently.                              Internal Pelvic Floor: Patient fully consents to today's internal vaginal examination. She presents with wide introital opening and scar tissue present from birth-related tearing. She had no pain with palpation of the introitus or superficial muscles, but movement caused tenderness internally and externally at scarring sites. She has a weakened pelvic floor contraction and stiffness present in the tissue. This stiffness improves with deep diaphragmatic breathing, so we  began working on pelvic floor range of motion training in supine today. Mild anterior/posterior vaginal wall laxity present with cough tests. No pain following examination.   Patient confirms identification and approves PT to assess internal pelvic floor and treatment Yes No emotional/communication barriers or cognitive limitation. Patient is motivated to learn. Patient understands and agrees with treatment goals and plan. PT explains patient will be examined in standing, sitting, and lying down to see how their muscles and joints work. When they are ready, they will be asked to remove their underwear so PT can examine their perineum. The patient is also given the option of providing their own chaperone as one is not provided in our facility. The patient also has the right and is explained the right to defer or refuse any part of the evaluation or treatment including the internal exam. With the patient's consent, PT will use one gloved finger to gently assess the muscles of the pelvic floor, seeing how well it contracts and relaxes and if there is muscle symmetry. After, the patient will get dressed and PT and patient will discuss exam findings and plan of care. PT and patient discuss plan of care, schedule, attendance policy and HEP activities.  PELVIC MMT:   MMT eval  Vaginal 3/5, 5 quick flicks, 10 second hold   Internal Anal Sphincter   External Anal Sphincter   Puborectalis   Diastasis Recti   (  Blank rows = not tested)       TONE: Low in bilateral aspects of superficial and deep pelvic floor musculature   PROLAPSE: Mild anterior/posterior vaginal wall laxity with cough test in supine   TODAY'S TREATMENT:                                                                                                                              DATE:   EVAL 10/25/23: Examination completed, findings reviewed, pt educated on POC, HEP, and self care. Pt motivated to participate in PT and agreeable to attempt  recommendations.   Hooklying diaphragmatic breathing + pelvic floor lengthening with inhalation + shortening with exhalation 2x10  Hooklying pelvic floor quick flick contractions + diaphragmatic breathing 2x10  Education on perineal body care and maintenance while her tissue is healing, relative anatomy and the connection between the diaphragm and pelvic floor   PATIENT EDUCATION:  Education details: see above Person educated: Patient Education method: Explanation, Demonstration, Tactile cues, Verbal cues, and Handouts Education comprehension: verbalized understanding, returned demonstration, verbal cues required, tactile cues required, and needs further education  HOME EXERCISE PROGRAM: Access Code: [email or web address] URL: [email or web address] Date: 10/25/2023 Prepared by: Celena Domino  Exercises - Supine Pelvic Floor Contraction  - 1 x daily - 7 x weekly - 2 sets - 10 reps - Quick Flick Pelvic Floor Contractions in Hooklying  - 1 x daily - 7 x weekly - 2 sets - 10 reps  ASSESSMENT:  CLINICAL IMPRESSION: Patient is a 38 y.o. female  who was seen today for physical therapy evaluation and treatment for postpartum pelvic floor pain after having her second baby (VBAC) in July. She had this birth with an epidural and experienced a lot of internal tearing along with labial/clitoral tearing. After this, she had to be admitted to the operating room for almost 2 hours. She also had some postpartum hemorrhaging as well. She has had vaginal pain since the birth. She saw a urogyn and learned she had some extra skin growth in the healing space - she got a silver nitrate treatment for this and it has taken her pain down from an 8/10 to 1/10. She will feel vaginal heaviness occasionally and she will also feel this with bending forward to pick up baby. She has not returned to intercourse but has been cleared for penetration. Patient fully consents to today's vaginal examination. She presents with mild  dryness present with slightly limited clitoral hood mobility secondary to periclitoral/periurethral scarring from birth. Granulation tissue from silver nitrate solution present, white/pink and healing sufficiently. She presents with wide introital opening and scar tissue present from birth-related tearing. She had no pain with palpation of the introitus or superficial muscles, but movement caused tenderness internally and externally at scarring sites. She has a weakened pelvic floor contraction and stiffness present in the tissue. This stiffness improves with deep diaphragmatic breathing, so we began working on pelvic floor range of motion training in supine  today. Mild anterior/posterior vaginal wall laxity present with cough tests. No pain following examination. Overall, pt tolerated session well and Pt would benefit from additional PT to further address deficits.    OBJECTIVE IMPAIRMENTS: decreased coordination, decreased endurance, decreased mobility, decreased ROM, decreased strength, and pain.   ACTIVITY LIMITATIONS: continence, toileting, and intercourse  PARTICIPATION LIMITATIONS: intercourse and comfortable toileting  PERSONAL FACTORS: Age, Past/current experiences, and Time since onset of injury/illness/exacerbation are also affecting patient's functional outcome.   REHAB POTENTIAL: Excellent  CLINICAL DECISION MAKING: Stable/uncomplicated  EVALUATION COMPLEXITY: Low   GOALS: Goals reviewed with patient? Yes  SHORT TERM GOALS: Target date: 11/22/2023  Pt will be independent with HEP.  Baseline: Goal status: INITIAL  2.  Pt will be independent with the knack, urge suppression technique, and double voiding in order to improve bladder habits and decrease urinary incontinence.  Baseline:  Goal status: INITIAL  3.  Pt will be independent with use of squatty potty, relaxed toileting mechanics, and improved bowel movement techniques in order to increase ease of bowel movements and  complete evacuation.  Baseline:  Goal status: INITIAL  4.  Pt will be able to correctly perform diaphragmatic breathing and appropriate pressure management in order to prevent worsening vaginal wall laxity and improve pelvic floor A/ROM.  Baseline:  Goal status: INITIAL  LONG TERM GOALS: Target date: 04/23/2024  Pt will be independent with advanced HEP.  Baseline:  Goal status: INITIAL  2.  Pt to demonstrate improved coordination of pelvic floor and breathing mechanics with 10# squat with appropriate synergistic patterns to decrease pain and leakage at least 75% of the time for improved ability to complete a 30 minute workout with strain at pelvic floor and symptoms.   Baseline:  Goal status: INITIAL  3.  Pt will demonstrate normal pelvic floor muscle tone and A/ROM, able to achieve 3+/5 strength with contractions and 10 sec endurance, in order to reduce pelvic pain and allow pt to return to comfortable intercourse. Baseline:  Goal status: INITIAL  4.  Pt will report 75% reduction of pain due to improvements in posture, strength, and muscle length in pelvic floor musculature to improve quality of life.  Baseline:  Goal status: INITIAL  PLAN:  PT FREQUENCY: 1-2x/week  PT DURATION: 6 months  PLANNED INTERVENTIONS: 97110-Therapeutic exercises, 97530- Therapeutic activity, 97112- Neuromuscular re-education, 97535- Self Care, [id number]- Manual therapy, Patient/Family education, Taping, Joint mobilization, Spinal mobilization, Scar mobilization, Cryotherapy, Moist heat, and Biofeedback  PLAN FOR NEXT SESSION: continued internal treatment and downtraining for pelvic floor tension, introduce progressive range of motion training exercises, manual to abdomen, core strengthening, prolapse education and management   Celena JAYSON Domino, PT 10/25/2023, 3:34 PM [facility] [address] [place], [place] [address] Phone # [phone] Fax [phone]

## 2023-10-30 ENCOUNTER — Telehealth: Payer: Self-pay | Admitting: Physical Therapy

## 2023-10-30 NOTE — Telephone Encounter (Signed)
 Left pt message regarding today's opening, 10/30/23 at 4:15 PM - pt advised to call office back if she would like this spot  Celena Domino, PT, DPT 10/30/23 8:24 AM [facility] [address], [address] [place], [place] [address] Phone # [phone] Fax [phone]

## 2023-11-14 ENCOUNTER — Ambulatory Visit: Payer: Self-pay | Admitting: Physical Therapy

## 2023-11-20 ENCOUNTER — Encounter: Payer: Self-pay | Admitting: Obstetrics and Gynecology

## 2023-11-20 ENCOUNTER — Ambulatory Visit: Admitting: Obstetrics and Gynecology

## 2023-11-20 VITALS — BP 124/88 | HR 97

## 2023-11-20 DIAGNOSIS — N898 Other specified noninflammatory disorders of vagina: Secondary | ICD-10-CM

## 2023-11-20 NOTE — Progress Notes (Signed)
 [facility] Return Visit  SUBJECTIVE  History of Present Illness: Jasmin Jackson is a 38 y.o. female seen in follow-up for perineal pain/ granulation tissue. Last visit granulation tissue was treated at the introitus and along the right sidewall.   Pain has improved. After walking she has some soreness but more tolerable. Has not noticed a little dark blood for one day. She has been using the estrace cream most days. Lidocaine  cream helps a little bit. She saw her OBGYN at the end of September and had the granulation tissue treated again.   Past Medical History: Patient  has a past medical history of Alopecia, Anxiety, Chlamydia, COVID-19 (06/05/2020), Fibroadenoma of right breast, Headache, Healthcare maintenance (01/08/2023), HSV infection, Migraines (02/15/2023), and Uterine fibroid (07/29/2020).   Past Surgical History: She  has a past surgical history that includes Cesarean section (10/18/2020) and Repair vaginal cuff (N/A, 08/21/2023).   Medications: She has a current medication list which includes the following prescription(s): estradiol, ferrous sulfate , lidocaine -prilocaine , and prenatal vit-fe fumarate-fa.   Allergies: Patient has no known allergies.   Social History: Patient  reports that she has never smoked. She has never used smokeless tobacco. She reports that she does not currently use alcohol. She reports that she does not use drugs.     OBJECTIVE     Physical Exam: Vitals:   11/20/23 1353  BP: 124/88  Pulse: 97   Gen: No apparent distress, A&O x 3.  Detailed Urogynecologic Evaluation:  1cm area of granulation tissue at the right sulcus, treated again with silver nitrate. No granulation tissue at the perineum   ASSESSMENT AND PLAN    Jasmin Jackson is a 38 y.o. with:  1. Granulation tissue of vagina     Granulation tissue of vagina Assessment & Plan: - Improvement since last treatment. Treated again today with silver nitrate.  - She  should continue using vaginal estrogen cream 3 times a week.  - We discussed that if the silver nitrate does not resolve all of the granulation tissue, then can consider removal in the office or OR.    Return 3-4 weeks  Jasmin LOISE Caper, MD

## 2023-11-20 NOTE — Assessment & Plan Note (Signed)
-   Improvement since last treatment. Treated again today with silver nitrate.  - She should continue using vaginal estrogen cream 3 times a week.  - We discussed that if the silver nitrate does not resolve all of the granulation tissue, then can consider removal in the office or OR.

## 2023-11-30 ENCOUNTER — Ambulatory Visit: Admitting: Obstetrics and Gynecology

## 2023-12-11 ENCOUNTER — Ambulatory Visit (INDEPENDENT_AMBULATORY_CARE_PROVIDER_SITE_OTHER): Admitting: Obstetrics and Gynecology

## 2023-12-11 VITALS — BP 125/88 | HR 98

## 2023-12-11 DIAGNOSIS — N898 Other specified noninflammatory disorders of vagina: Secondary | ICD-10-CM

## 2023-12-11 NOTE — Progress Notes (Unsigned)
 [facility] Return Visit  SUBJECTIVE  History of Present Illness: Jasmin Jackson is a 38 y.o. female seen in follow-up for perineal pain/ granulation tissue.   Pain has been about the same. Has been back at work and moving a lot more. She has not noticed any unusual vaginal bleeding.   Past Medical History: Patient  has a past medical history of Alopecia, Anxiety, Chlamydia, COVID-19 (06/05/2020), Fibroadenoma of right breast, Headache, Healthcare maintenance (01/08/2023), HSV infection, Migraines (02/15/2023), and Uterine fibroid (07/29/2020).   Past Surgical History: She  has a past surgical history that includes Cesarean section (10/18/2020) and Repair vaginal cuff (N/A, 08/21/2023).   Medications: She has a current medication list which includes the following prescription(s): estradiol, ferrous sulfate , lidocaine -prilocaine , and prenatal vit-fe fumarate-fa.   Allergies: Patient has no known allergies.   Social History: Patient  reports that she has never smoked. She has never used smokeless tobacco. She reports that she does not currently use alcohol. She reports that she does not use drugs.     OBJECTIVE     Physical Exam: Vitals:   12/11/23 1550  BP: 125/88  Pulse: 98    Gen: No apparent distress, A&O x 3.  Detailed Urogynecologic Evaluation:  1cm raised area of granulation tissue at the right sulcus, treated again with silver nitrate. No granulation tissue at the perineum   ASSESSMENT AND PLAN    Jasmin Jackson is a 38 y.o. with:  1. Granulation tissue of vagina     - We discussed removing the area of granulation tissue in office or OR since it is raised and had two prior treatments with silver nitrtate. She did not want to attempt office procedure today but ok to try at next visit. We reviewed this would involve injection of lidocaine .  - Return 3 weeks  Jasmin LOISE Caper, MD  Time spent: I spent 20 minutes dedicated to the care of this  patient on the date of this encounter to include pre-visit review of records, face-to-face time with the patient  and post visit documentation.

## 2023-12-12 ENCOUNTER — Encounter: Payer: Self-pay | Admitting: Obstetrics and Gynecology

## 2024-01-07 ENCOUNTER — Ambulatory Visit: Admitting: Family Medicine

## 2024-01-07 ENCOUNTER — Ambulatory Visit: Admitting: Obstetrics and Gynecology

## 2024-01-11 ENCOUNTER — Ambulatory Visit: Admitting: Family Medicine

## 2024-01-11 ENCOUNTER — Encounter: Payer: Self-pay | Admitting: Family Medicine

## 2024-01-11 VITALS — BP 114/74 | HR 71 | Ht 67.0 in | Wt 148.1 lb

## 2024-01-11 DIAGNOSIS — G43701 Chronic migraine without aura, not intractable, with status migrainosus: Secondary | ICD-10-CM

## 2024-01-11 MED ORDER — PREDNISONE 20 MG PO TABS: 20.0000 mg | ORAL_TABLET | Freq: Every day | ORAL | 0 refills | Status: AC

## 2024-01-11 MED ORDER — QULIPTA 60 MG PO TABS: 60.0000 mg | ORAL_TABLET | Freq: Every day | ORAL | 3 refills | Status: AC

## 2024-01-11 MED ORDER — ONDANSETRON 4 MG PO TBDP: 4.0000 mg | ORAL_TABLET | Freq: Three times a day (TID) | ORAL | 0 refills | Status: AC | PRN

## 2024-01-11 MED ORDER — SUMATRIPTAN SUCCINATE 25 MG PO TABS: 25.0000 mg | ORAL_TABLET | ORAL | 0 refills | Status: AC | PRN

## 2024-01-11 NOTE — Progress Notes (Signed)
 Established patient visit   Patient: Jasmin Jackson   DOB: 01-Oct-1985   38 y.o. Female  MRN: [id number] Visit Date: 01/11/2024  Today's healthcare provider: Rockie Agent, MD   Chief Complaint  Patient presents with   Acute Visit    Migraines X 1 month worsening . Hx of migraines. Reports episode of recurrent migraines daily for 1 week. She takes ibuprofen  or tylenol  but typically it does not help. Reports last migraine was Saturday or Sunday lasting all day. She reports waking up with them sometimes and other times they start throughout the day and linger.    Subjective     HPI     Acute Visit    Additional comments: Migraines X 1 month worsening . Hx of migraines. Reports episode of recurrent migraines daily for 1 week. She takes ibuprofen  or tylenol  but typically it does not help. Reports last migraine was Saturday or Sunday lasting all day. She reports waking up with them sometimes and other times they start throughout the day and linger.       Last edited by Lilian Fitzpatrick, CMA on 01/11/2024  1:09 PM.       Discussed the use of AI scribe software for clinical note transcription with the patient, who gave verbal consent to proceed.  History of Present Illness Jasmin Jackson is a 38 year old female with chronic migraines who presents with worsening migraine episodes.  She has been experiencing worsening migraines over the past month, with daily episodes occurring for one week. The migraines last all day, sometimes starting upon waking or developing throughout the day. The last migraine occurred on Saturday and lasted all day. She is not experiencing a headache today.  She has tried ibuprofen  and Tylenol  for relief, but these medications have not been effective. In the past, she was prescribed Imitrex and Maxalt, which caused sleepiness and did not provide relief. She recalls being prescribed a medication that might have started with a 'T' while  living in [place] over ten years ago, but cannot remember the name. She has not tried any prescription medications recently other than over-the-counter options. Chart review shows that she was previously prescribed fiorcet for migraine acute treatment.   She is currently breastfeeding her almost five-month-old child, which presents challenges in managing her migraines and medication use. Her migraines cause pain with sudden movements, such as laughing, and can last the entire day, making strenuous activities difficult. She occasionally experiences nausea with her headaches. No vision changes, numbness, weakness, or tingling associated with her migraines.  She has a history of seeing a neurologist in [place], possibly involving a referral and a scan, but details are unclear.     Past Medical History:  Diagnosis Date   Alopecia    Anxiety    Chlamydia    COVID-19 06/05/2020   home test positive yesterday   Fibroadenoma of right breast    Headache    Migraines   Healthcare maintenance 01/08/2023   HSV infection    Migraines 02/15/2023   -recommend Magnesium , Co Enzyme Q 10, and Riboflavin      Uterine fibroid 07/29/2020    Medications: Show/hide medication list[1]  Review of Systems  Last CBC Lab Results  Component Value Date   WBC 8.5 08/22/2023   HGB 8.2 (L) 08/22/2023   HCT 24.6 (L) 08/22/2023   MCV 92.8 08/22/2023   MCH 29.3 08/22/2023   RDW 18.7 (H) 08/22/2023   PLT 148 (L) 08/22/2023  Last metabolic panel Lab Results  Component Value Date   GLUCOSE 125 (H) 08/21/2023   NA 135 08/21/2023   K 3.7 08/21/2023   CL 105 08/21/2023   CO2 20 (L) 08/21/2023   BUN <5 (L) 08/21/2023   CREATININE 0.50 08/21/2023   GFRNONAA >60 08/21/2023   CALCIUM 8.2 (L) 08/21/2023   PROT 8.0 06/09/2022   ALBUMIN 4.2 06/09/2022   LABGLOB 2.7 08/21/2019   AGRATIO 1.7 08/21/2019   BILITOT 0.7 06/09/2022   ALKPHOS 57 06/09/2022   AST 14 (L) 06/09/2022   ALT 12 06/09/2022    ANIONGAP 10 08/21/2023   Last lipids Lab Results  Component Value Date   CHOL 158 11/15/2022   HDL 49 11/15/2022   LDLCALC 95 11/15/2022   TRIG 69 11/15/2022   CHOLHDL 3.2 11/15/2022   Last hemoglobin A1c Lab Results  Component Value Date   HGBA1C 4.9 11/15/2022   Last thyroid  functions Lab Results  Component Value Date   TSH 1.890 03/15/2023   Last vitamin D Lab Results  Component Value Date   VD25OH 13.9 (L) 08/21/2019   Last vitamin B12 and Folate No results found for: VITAMINB12, FOLATE      Objective    BP 114/74 (BP Location: Left Arm, Patient Position: Sitting, Cuff Size: Normal)   Pulse 71   Ht 5' 7 (1.702 m)   Wt 148 lb 1.6 oz (67.2 kg)   LMP 12/31/2023   SpO2 98%   Breastfeeding Yes   BMI 23.20 kg/m   BP Readings from Last 3 Encounters:  01/11/24 114/74  12/11/23 125/88  11/20/23 124/88   Wt Readings from Last 3 Encounters:  01/11/24 148 lb 1.6 oz (67.2 kg)  10/19/23 141 lb 9.6 oz (64.2 kg)  08/20/23 163 lb (73.9 kg)        Physical Exam  Physical Exam GENERAL: No acute distress. NEUROLOGICAL: Pupils equal, round, and reactive to light bilaterally. Extraocular movements intact, cranial nerves II-XII grossly intact bilaterally. 5/5 strength in bilateral upper and lower extremities. Sensation intact.    No results found for any visits on 01/11/24.  Assessment & Plan     Problem List Items Addressed This Visit     Migraines - Primary   Chronic migraine without aura with status migrainosus Chronic migraines have worsened over the past month, with daily episodes for one week. Migraines are persistent, lasting all day, and are not relieved by ibuprofen  or acetaminophen . No current headache. Previous treatments with Imitrex and Maxalt were ineffective and caused drowsiness. No aura or complex migraine symptoms. Nausea occurred once last week. Breastfeeding status complicates medication choices. - Prescribed Qulipta 60 mg daily for  migraine prevention and acute treatment, pending prior authorization. - Prescribed Imitrex 25 mg for acute migraine episodes, with instructions to take a second dose if needed after two hours, not exceeding two doses in 24 hours. - Prescribed prednisone 20 mg tablets as a backup if Imitrex is ineffective. - Prescribed Zofran  4 mg disintegrating tablets for nausea associated with migraines. - Advised to pause breastfeeding until medication regimen is established and effective.      Relevant Medications   Atogepant (QULIPTA) 60 MG TABS   predniSONE (DELTASONE) 20 MG tablet   ondansetron  (ZOFRAN -ODT) 4 MG disintegrating tablet   SUMAtriptan (IMITREX) 25 MG tablet    Assessment and Plan Assessment & Plan    Return in about 2 months (around 03/13/2024) for migraine .     Rockie Agent, MD  [facility]  Family Practice [phone] (phone) [phone] (fax)  [facility]     [1]  Outpatient Medications Prior to Visit  Medication Sig   estradiol (ESTRACE) 0.1 MG/GM vaginal cream Place 0.5 g vaginally. (Patient not taking: Reported on 01/11/2024)   ferrous sulfate  325 (65 FE) MG EC tablet Take 1 tablet (325 mg total) by mouth every other day. (Patient not taking: Reported on 01/11/2024)   lidocaine -prilocaine  (EMLA ) cream Apply 1 Application topically as needed. (Patient not taking: Reported on 01/11/2024)   Prenatal Vit-Fe Fumarate-FA (PRENATAL VITAMIN PO) Take 2 capsules by mouth daily. (Patient not taking: Reported on 01/11/2024)   No facility-administered medications prior to visit.

## 2024-01-11 NOTE — Assessment & Plan Note (Signed)
 Chronic migraine without aura with status migrainosus Chronic migraines have worsened over the past month, with daily episodes for one week. Migraines are persistent, lasting all day, and are not relieved by ibuprofen  or acetaminophen . No current headache. Previous treatments with Imitrex and Maxalt were ineffective and caused drowsiness. No aura or complex migraine symptoms. Nausea occurred once last week. Breastfeeding status complicates medication choices. - Prescribed Qulipta 60 mg daily for migraine prevention and acute treatment, pending prior authorization. - Prescribed Imitrex 25 mg for acute migraine episodes, with instructions to take a second dose if needed after two hours, not exceeding two doses in 24 hours. - Prescribed prednisone 20 mg tablets as a backup if Imitrex is ineffective. - Prescribed Zofran  4 mg disintegrating tablets for nausea associated with migraines. - Advised to pause breastfeeding until medication regimen is established and effective.

## 2024-01-11 NOTE — Patient Instructions (Addendum)
°  VISIT SUMMARY: You visited today due to worsening chronic migraines that have been occurring daily for the past week. We discussed your current symptoms, previous treatments, and the challenges of managing migraines while breastfeeding.  YOUR PLAN: CHRONIC MIGRAINE WITHOUT AURA WITH STATUS MIGRAINOSUS: Your chronic migraines have worsened recently, with daily episodes lasting all day. Over-the-counter medications have not been effective, and previous prescription treatments caused drowsiness and did not provide relief. -Start taking Qulipta 60 mg daily for migraine prevention and acute treatment. This will require prior authorization. -For acute migraine episodes, take Imitrex 25 mg. If needed, you can take a second dose after two hours, but do not exceed two doses in 24 hours. -If Imitrex is not effective, take prednisone 20 mg tablets as a backup. -For nausea associated with migraines, take Zofran  4 mg disintegrating tablets. -Pause breastfeeding until your medication regimen is established and effective.    To keep you healthy, please keep in mind the following health maintenance items that you are due for:   Health Maintenance Due  Topic Date Due   Hepatitis B Vaccines 19-59 Average Risk (1 of 3 - 19+ 3-dose series) Never done   HPV VACCINES (1 - 3-dose SCDM series) Never done   COVID-19 Vaccine (3 - Pfizer risk series) 04/03/2019     Best Wishes,   Dr. Lang

## 2024-01-14 ENCOUNTER — Other Ambulatory Visit (HOSPITAL_COMMUNITY): Payer: Self-pay

## 2024-01-14 ENCOUNTER — Telehealth: Payer: Self-pay | Admitting: Pharmacy Technician

## 2024-01-14 NOTE — Telephone Encounter (Signed)
 Pharmacy Patient Advocate Encounter   Received notification from Onbase that prior authorization for Qulipta  60mg  tablets is required/requested.   Insurance verification completed.   The patient is insured through [facility].   Per test claim: Per test claim, medication is not covered due to plan/benefit exclusion, PA not submitted at this time

## 2024-01-15 ENCOUNTER — Telehealth: Payer: Self-pay

## 2024-01-15 ENCOUNTER — Other Ambulatory Visit (HOSPITAL_COMMUNITY): Payer: Self-pay

## 2024-01-15 DIAGNOSIS — G43701 Chronic migraine without aura, not intractable, with status migrainosus: Secondary | ICD-10-CM

## 2024-01-15 NOTE — Telephone Encounter (Signed)
 Pharmacy Patient Advocate Encounter   Received notification from cc'd pharmacy that prior authorization for Qulipta  60MG  tablets is required/requested.   Insurance verification completed.   The patient is insured through [facility].   Per test claim: PA required; PA submitted to above mentioned insurance via Latent Key/confirmation #/[facility] Status is pending

## 2024-01-17 ENCOUNTER — Other Ambulatory Visit (HOSPITAL_COMMUNITY): Payer: Self-pay

## 2024-01-17 NOTE — Telephone Encounter (Signed)
 Pharmacy Patient Advocate Encounter  Received notification from [facility] that Prior Authorization for Qulipta  60mg  has been DENIED.  Full denial letter will be uploaded to the media tab. See denial reason below. This medicaiton is not on the formulary. The member must try and fail (did not work), or be unable to take ALL formulary alternatives (due to interactions, side effects, etc.).In this case, other formulary alternatives are available for the member to take. Alternative medications include: Aimovig, Ajovy, and Emglaity 120mg .  PA #/Case ID/Reference #: [id number]

## 2024-01-18 NOTE — Telephone Encounter (Signed)
 MyChart message sent to patient, will consider neuro referral for these prescriptions

## 2024-01-21 NOTE — Addendum Note (Signed)
 Addended by: LILIAN SEVERO RAMAN on: 01/21/2024 10:03 AM   Modules accepted: Orders

## 2024-02-05 ENCOUNTER — Ambulatory Visit: Attending: Obstetrics and Gynecology | Admitting: Physical Therapy

## 2024-02-05 DIAGNOSIS — M6281 Muscle weakness (generalized): Secondary | ICD-10-CM | POA: Insufficient documentation

## 2024-02-05 DIAGNOSIS — R293 Abnormal posture: Secondary | ICD-10-CM | POA: Insufficient documentation

## 2024-02-05 DIAGNOSIS — R279 Unspecified lack of coordination: Secondary | ICD-10-CM | POA: Insufficient documentation

## 2024-02-05 NOTE — Therapy (Signed)
 " OUTPATIENT PHYSICAL THERAPY FEMALE PELVIC TREATMENT   Patient Name: Jasmin Jackson MRN: [id number] DOB:19-Mar-1985, 39 y.o., female Today's Date: 02/05/2024  END OF SESSION:  PT End of Session - 02/05/24 0825     Visit Number 2    Number of Visits 6    Date for Recertification  12/06/23    Authorization Type [employer]    Authorization - Number of Visits 40    PT Start Time 0800    PT Stop Time 0845    PT Time Calculation (min) 45 min    Activity Tolerance Patient tolerated treatment well    Behavior During Therapy [facility] for tasks assessed/performed           Past Medical History:  Diagnosis Date   Alopecia    Anxiety    Chlamydia    COVID-19 06/05/2020   home test positive yesterday   Fibroadenoma of right breast    Headache    Migraines   Healthcare maintenance 01/08/2023   HSV infection    Migraines 02/15/2023   -recommend Magnesium , Co Enzyme Q 10, and Riboflavin      Uterine fibroid 07/29/2020   Past Surgical History:  Procedure Laterality Date   CESAREAN SECTION  10/18/2020   Procedure: CESAREAN SECTION;  Surgeon: Janit Alm Agent, MD;  Location: [facility] ORS;  Service: Obstetrics;;   REPAIR VAGINAL CUFF N/A 08/21/2023   Procedure: Repair of bilateral labial lacerations, bilateral vaginal sidewall lacerations, right sulcal laceration, first degree perineal laceration, periurethral laceration, periclitoral laceration, anterior vaginal laceration;  Surgeon: Schermerhorn, Beverli GAILS, MD;  Location: [facility] OR[facility];  Service: Gynecology;  Laterality: N/A;   Patient Active Problem List   Diagnosis Date Noted   Granulation tissue of vagina 10/19/2023   Postpartum perineal pain 10/19/2023   Postpartum hemorrhage 08/22/2023   NSVD (normal spontaneous vaginal delivery) 08/21/2023   VBAC (vaginal birth after Cesarean) 08/21/2023   Extensive obstetrical laceration 08/21/2023   Encounter for elective induction of labor 08/20/2023   Anemia affecting pregnancy 08/20/2023    Anxiety during pregnancy 08/20/2023   History of C-section 02/15/2023   Migraines 02/15/2023   AMA (advanced maternal age) multigravida 35+ 02/15/2023   Blood in stool 02/15/2023   Supervision of other normal pregnancy, antepartum 01/11/2023   HSV infection    Uterine fibroid 07/29/2020    PCP: Sharma Coyer, MD  REFERRING PROVIDER: Marilynne Rosaline SAILOR, MD  REFERRING DIAG: [phone] (ICD-10-CM) - Postpartum perineal pain  THERAPY DIAG:  Muscle weakness (generalized)  Unspecified lack of coordination  Abnormal posture  Rationale for Evaluation and Treatment: Rehabilitation  ONSET DATE: July 21st, 2025  SUBJECTIVE:  SUBJECTIVE STATEMENT: She had some more silver nitrate treatments and she is no longer in any pain in that area. She has some lingering discomfort with prolonged activity in the day. She thinks this could be scar tissue. 0/10 pain - period has returned since stopping breastfeeding. Vaginal heaviness is still present with bowel movements - this is intermittent. Intercourse has been tolerable. Regular bowel movements. No urinary leakage to report, just increased urgency.   From eval: Patient reports to PFPT after having her second child vaginally (VBAC) in July. She had this birth with an epidural and experienced a lot of internal tearing along with labial/clitoral tearing. After this, she had to be admitted to the operating room for almost 2 hours. She also had some postpartum hemorrhaging as well. She has had vaginal pain since the birth. She saw a urogyn and learned she had some extra skin growth in the healing space - she got a silver nitrate treatment for this and it has taken her pain down from an 8/10 to 1/10. She will feel vaginal heaviness occasionally and she will also  feel this with bending forward to pick up baby.  Fluid intake: currently breastfeeding, drinks 2 full 40 oz water bottles per day, body armour, a soda occasionally, hot tea every night, coffee 4 out of 7 days per week (1 cup)   FUNCTIONAL LIMITATIONS: still gets sore but her major functional limitations are much better since her pain is less   PERTINENT HISTORY:  Medications for current condition: estrogen cream, Flaygl Surgeries: C-section 2022  Other:  Sexual abuse: No  PAIN:  Are you having pain? Yes NPRS scale: 1/10 Pain location: Internal, Deep, Right, and Vaginal  Pain type: sharp Pain description: constant   Aggravating factors: movement  Relieving factors: resting   PRECAUTIONS: None  RED FLAGS: None   WEIGHT BEARING RESTRICTIONS: No  FALLS:  Has patient fallen in last 6 months? 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   ACTIVITY LEVEL : walked 1.5 miles day before yesterday with no pain, just soreness; up and about at the house   PLOF: Independent  PATIENT GOALS: to not have vaginal heaviness, to help with the pain   BOWEL MOVEMENT: Pain with bowel movement: No Type of bowel movement:Type (Bristol Stool Scale) 4, Frequency 1x/day, Strain no, and Splinting no Fully empty rectum: Yes:   Leakage: No                                                     Caused by: no Pads: Yes: 3 times for comfort levels  Fiber supplement/laxative No  URINATION: Pain with urination: No Fully empty bladder: Yes:                                  Post-void dribble: Yes  Stream: Strong Urgency: Yes  Frequency:during the day within normal limits                                                          Nocturia: Yes: 1x/night  Leakage: none Pads/briefs: No  INTERCOURSE: has not had intercourse yet - but has been technically cleared   Ability to have vaginal penetration Yes  Pain with intercourse: none Dryness: No Climax: yes Marinoff Scale:  0/3 Lubricant: no  PREGNANCY: Vaginal deliveries 1 Tearing Yes: extensive lacerations (bilateral labial, bilateral vaginal sidewall, right sulcal, first degree perineal, periurethral, periclitoral, left vaginal, and anterior vaginal lacerations) requiring repair in the OR and a postpartum hemorrhage. She received 1 u pRBC.  Episiotomy No C-section deliveries 1 Currently pregnant No  PROLAPSE: Pressure  OBJECTIVE:  Note: Objective measures were completed at Evaluation unless otherwise noted. PATIENT SURVEYS:  PFIQ-7: 45  COGNITION: Overall cognitive status: Within functional limits for tasks assessed    SENSATION: Light touch: Appears intact  LUMBAR SPECIAL TESTS:  Single leg stance test: Positive  FUNCTIONAL TESTS:  Single leg stance:  Rt: +   Lt: +  Sit-up test: 2/3 Squat: within normal limits with no pain Bed mobility: within normal limits   GAIT: Assistive device utilized: None Comments: mild trendelenburg gait pattern with ambulation   POSTURE: rounded shoulders  LUMBARAROM/PROM: within functional limits for all motions tested bilaterally with no pain   LOWER EXTREMITY ROM: within normal limits for all motions tested bilaterally with no pain   LOWER EXTREMITY MMT: 4/5 bilateral knees and hips grossly with no pain   PALPATION:  General: no tenderness to palpation of bilateral adductors or hip flexors   Pelvic Alignment: within normal limits   Abdominal: abdominal bracing at rest   Diastasis: Yes: mild at umbilicus  Distortion: No  Breathing: upper chest breathing, decreased lower rib excursion with inhalation  Scar tissue: Yes: C-section scar                External Perineal Exam: mild dryness present with slightly limited clitoral hood mobility secondary to periclitoral/periurethral scarring from birth. Granulation tissue from silver nitrate solution present, white/pink and healing sufficiently.                              Internal Pelvic Floor:  Patient fully consents to today's internal vaginal examination. She presents with wide introital opening and scar tissue present from birth-related tearing. She had no pain with palpation of the introitus or superficial muscles, but movement caused tenderness internally and externally at scarring sites. She has a weakened pelvic floor contraction and stiffness present in the tissue. This stiffness improves with deep diaphragmatic breathing, so we began working on pelvic floor range of motion training in supine today. Mild anterior/posterior vaginal wall laxity present with cough tests. No pain following examination.   Patient confirms identification and approves PT to assess internal pelvic floor and treatment Yes No emotional/communication barriers or cognitive limitation. Patient is motivated to learn. Patient understands and agrees with treatment goals and plan. PT explains patient will be examined in standing, sitting, and lying down to see how their muscles and joints work. When they are ready, they will be asked to remove their underwear so PT can examine their perineum. The patient is also given the option of providing their own chaperone as one is not provided in our facility. The patient also has the right and is explained the right to defer or refuse any part of the evaluation or treatment including the internal exam. With the patient's consent, PT will use one gloved finger to gently assess the muscles of the pelvic floor, seeing how well it contracts and  relaxes and if there is muscle symmetry. After, the patient will get dressed and PT and patient will discuss exam findings and plan of care. PT and patient discuss plan of care, schedule, attendance policy and HEP activities.  PELVIC MMT:   MMT eval  Vaginal 3/5, 5 quick flicks, 10 second hold   Internal Anal Sphincter   External Anal Sphincter   Puborectalis   Diastasis Recti   (Blank rows = not tested)       TONE: Low in bilateral  aspects of superficial and deep pelvic floor musculature   PROLAPSE: Mild anterior/posterior vaginal wall laxity with cough test in supine   TODAY'S TREATMENT:                                                                                                                              DATE:   EVAL 10/25/23: Examination completed, findings reviewed, pt educated on POC, HEP, and self care. Pt motivated to participate in PT and agreeable to attempt recommendations.   Hooklying diaphragmatic breathing + pelvic floor lengthening with inhalation + shortening with exhalation 2x10  Hooklying pelvic floor quick flick contractions + diaphragmatic breathing 2x10  Education on perineal body care and maintenance while her tissue is healing, relative anatomy and the connection between the diaphragm and pelvic floor   02/05/24: Urge drill for managing urge urinary incontinence  Seated transverse abdominis contraction + ball press + diaphragmatic breathing 2x10   seated diaphragmatic breathing + pelvic floor lengthening with inhalation + shortening with exhalation 2x10  seated pelvic floor quick flick contractions + diaphragmatic breathing 2x10  Bridge + adductor ball squeeze + diaphragmatic breathing 2x12  Sidelying clamshell + reverse clamshell + diaphragmatic breathing 2x10  C-section scar tissue massage  Silicone cupping over scar site to promote blood flow and tissue motility  Education for scar tissue massage 3-4x/wk for 5 minutes at a time   PATIENT EDUCATION:  Education details: see above Person educated: Patient Education method: Programmer, Multimedia, Facilities Manager, Actor cues, Verbal cues, and Handouts Education comprehension: verbalized understanding, returned demonstration, verbal cues required, tactile cues required, and needs further education  HOME EXERCISE PROGRAM: Access Code: [email or web address] URL: [email or web address] Date: 02/05/2024 Prepared by: Celena Domino  Exercises - Seated  Pelvic Floor Contraction  - 1 x daily - 7 x weekly - 2 sets - 10 reps - Seated Quick Flick Pelvic Floor Contractions  - 1 x daily - 7 x weekly - 2 sets - 10 reps - Seated Abdominal Press into [employer]  - 1 x daily - 7 x weekly - 2 sets - 10 reps - Supine Bridge with Mini Swiss Ball Between Knees  - 1 x daily - 7 x weekly - 2 sets - 10 reps - Clamshell  - 1 x daily - 7 x weekly - 2 sets - 10 reps - Sidelying Reverse Clamshell  - 1 x daily - 7 x weekly - 2 sets - 10 reps  ASSESSMENT:  CLINICAL IMPRESSION: Patient is a 39 y.o. female  who was seen today for physical therapy treatment for postpartum pelvic floor pain after having her second baby (VBAC) in July. Pt received silver nitrate treatments in her perineal space to support tissue healing and she is no longer in any pain in the pelvic floor. No urinary or bowel concerns to report, some heaviness with straining to pass bowel movements. Urge drill introduced today and pelvic floor training progressed with additional gravitational load. Pt tolerated gluteal strengthening and core strengthening very well and had no pain with this. Pt educated on scar tissue mobilization at C-section scar site to promote tissue healing and skin motility. No pain with this or by end of session. Overall, pt tolerated session well and Pt would benefit from additional PT to further address deficits.    OBJECTIVE IMPAIRMENTS: decreased coordination, decreased endurance, decreased mobility, decreased ROM, decreased strength, and pain.   ACTIVITY LIMITATIONS: continence, toileting, and intercourse  PARTICIPATION LIMITATIONS: intercourse and comfortable toileting  PERSONAL FACTORS: Age, Past/current experiences, and Time since onset of injury/illness/exacerbation are also affecting patient's functional outcome.   REHAB POTENTIAL: Excellent  CLINICAL DECISION MAKING: Stable/uncomplicated  EVALUATION COMPLEXITY: Low   GOALS: Goals reviewed with patient?  Yes  SHORT TERM GOALS: Target date: 11/22/2023  Pt will be independent with HEP.  Baseline: Goal status: INITIAL  2.  Pt will be independent with the knack, urge suppression technique, and double voiding in order to improve bladder habits and decrease urinary incontinence.  Baseline:  Goal status: INITIAL  3.  Pt will be independent with use of squatty potty, relaxed toileting mechanics, and improved bowel movement techniques in order to increase ease of bowel movements and complete evacuation.  Baseline:  Goal status: INITIAL  4.  Pt will be able to correctly perform diaphragmatic breathing and appropriate pressure management in order to prevent worsening vaginal wall laxity and improve pelvic floor A/ROM.  Baseline:  Goal status: INITIAL  LONG TERM GOALS: Target date: 04/23/2024  Pt will be independent with advanced HEP.  Baseline:  Goal status: INITIAL  2.  Pt to demonstrate improved coordination of pelvic floor and breathing mechanics with 10# squat with appropriate synergistic patterns to decrease pain and leakage at least 75% of the time for improved ability to complete a 30 minute workout with strain at pelvic floor and symptoms.   Baseline:  Goal status: INITIAL  3.  Pt will demonstrate normal pelvic floor muscle tone and A/ROM, able to achieve 3+/5 strength with contractions and 10 sec endurance, in order to reduce pelvic pain and allow pt to return to comfortable intercourse. Baseline:  Goal status: INITIAL  4.  Pt will report 75% reduction of pain due to improvements in posture, strength, and muscle length in pelvic floor musculature to improve quality of life.  Baseline:  Goal status: INITIAL  PLAN:  PT FREQUENCY: 1-2x/week  PT DURATION: 6 months  PLANNED INTERVENTIONS: 97110-Therapeutic exercises, 97530- Therapeutic activity, 97112- Neuromuscular re-education, 97535- Self Care, [id number]- Manual therapy, Patient/Family education, Taping, Joint mobilization, Spinal  mobilization, Scar mobilization, Cryotherapy, Moist heat, and Biofeedback  PLAN FOR NEXT SESSION: continued internal treatment and downtraining for pelvic floor tension, introduce progressive range of motion training exercises, manual to abdomen, core strengthening, prolapse education and management   Celena JAYSON Domino, PT 02/05/2024, 8:44 AM [facility] [address] [place], [place] [address] Phone # [phone] Fax [phone]   "

## 2024-02-05 NOTE — Patient Instructions (Signed)

## 2024-02-19 ENCOUNTER — Ambulatory Visit: Admitting: Physical Therapy

## 2024-03-04 ENCOUNTER — Ambulatory Visit: Admitting: Physical Therapy

## 2024-03-18 ENCOUNTER — Encounter: Admitting: Physical Therapy

## 2024-03-20 ENCOUNTER — Ambulatory Visit: Admitting: Family Medicine

## 2024-04-01 ENCOUNTER — Encounter: Admitting: Physical Therapy

## 2024-04-15 ENCOUNTER — Encounter: Admitting: Physical Therapy

## 2024-05-26 ENCOUNTER — Ambulatory Visit: Admitting: Neurology
